# Patient Record
Sex: Male | Born: 1980 | Race: Black or African American | Hispanic: No | Marital: Single | Smoking: Current every day smoker
Health system: Southern US, Community
[De-identification: ages and names within clinical notes are randomized; demographics above are authoritative.]

## PROBLEM LIST (undated history)

## (undated) ENCOUNTER — Emergency Department (HOSPITAL_COMMUNITY): Admission: EM | Payer: 59 | Source: Home / Self Care

## (undated) DIAGNOSIS — R569 Unspecified convulsions: Secondary | ICD-10-CM

## (undated) DIAGNOSIS — F209 Schizophrenia, unspecified: Secondary | ICD-10-CM

---

## 2005-12-15 ENCOUNTER — Emergency Department: Payer: Self-pay | Admitting: Emergency Medicine

## 2006-05-05 ENCOUNTER — Emergency Department: Payer: Self-pay | Admitting: Emergency Medicine

## 2006-05-14 ENCOUNTER — Emergency Department (HOSPITAL_COMMUNITY): Admission: EM | Admit: 2006-05-14 | Discharge: 2006-05-14 | Payer: Self-pay | Admitting: Emergency Medicine

## 2006-05-30 ENCOUNTER — Emergency Department (HOSPITAL_COMMUNITY): Admission: EM | Admit: 2006-05-30 | Discharge: 2006-05-30 | Payer: Self-pay | Admitting: Emergency Medicine

## 2006-12-08 ENCOUNTER — Encounter: Payer: Self-pay | Admitting: Nurse Practitioner

## 2006-12-21 ENCOUNTER — Encounter: Payer: Self-pay | Admitting: Nurse Practitioner

## 2007-01-20 ENCOUNTER — Encounter: Payer: Self-pay | Admitting: Nurse Practitioner

## 2007-08-04 ENCOUNTER — Emergency Department: Payer: Self-pay | Admitting: Internal Medicine

## 2007-12-29 ENCOUNTER — Ambulatory Visit: Payer: Self-pay | Admitting: Internal Medicine

## 2007-12-30 ENCOUNTER — Ambulatory Visit: Payer: Self-pay | Admitting: Internal Medicine

## 2009-05-20 ENCOUNTER — Emergency Department: Payer: Self-pay | Admitting: Emergency Medicine

## 2009-09-25 ENCOUNTER — Emergency Department: Payer: Self-pay | Admitting: Internal Medicine

## 2010-01-20 ENCOUNTER — Emergency Department: Payer: Self-pay | Admitting: Emergency Medicine

## 2010-02-09 ENCOUNTER — Inpatient Hospital Stay: Payer: Self-pay | Admitting: Psychiatry

## 2010-03-24 ENCOUNTER — Emergency Department (HOSPITAL_COMMUNITY): Admission: EM | Admit: 2010-03-24 | Discharge: 2010-03-24 | Payer: Self-pay | Admitting: Emergency Medicine

## 2010-04-28 ENCOUNTER — Emergency Department: Payer: Self-pay | Admitting: Emergency Medicine

## 2010-10-04 LAB — GC/CHLAMYDIA PROBE AMP, GENITAL: GC Probe Amp, Genital: NEGATIVE

## 2013-06-20 ENCOUNTER — Emergency Department (HOSPITAL_COMMUNITY): Payer: Medicaid Other

## 2013-06-20 ENCOUNTER — Encounter (HOSPITAL_COMMUNITY): Payer: Self-pay | Admitting: Emergency Medicine

## 2013-06-20 ENCOUNTER — Inpatient Hospital Stay (HOSPITAL_COMMUNITY)
Admission: EM | Admit: 2013-06-20 | Discharge: 2013-06-22 | DRG: 208 | Disposition: A | Discharge status: Home / Self Care | Payer: Self-pay | Attending: Pulmonary Disease | Admitting: Pulmonary Disease

## 2013-06-20 DIAGNOSIS — Z8659 Personal history of other mental and behavioral disorders: Secondary | ICD-10-CM

## 2013-06-20 DIAGNOSIS — F1020 Alcohol dependence, uncomplicated: Secondary | ICD-10-CM | POA: Diagnosis present

## 2013-06-20 DIAGNOSIS — F10929 Alcohol use, unspecified with intoxication, unspecified: Secondary | ICD-10-CM

## 2013-06-20 DIAGNOSIS — R4182 Altered mental status, unspecified: Secondary | ICD-10-CM

## 2013-06-20 DIAGNOSIS — J9690 Respiratory failure, unspecified, unspecified whether with hypoxia or hypercapnia: Secondary | ICD-10-CM | POA: Diagnosis present

## 2013-06-20 DIAGNOSIS — Z23 Encounter for immunization: Secondary | ICD-10-CM

## 2013-06-20 DIAGNOSIS — J9600 Acute respiratory failure, unspecified whether with hypoxia or hypercapnia: Principal | ICD-10-CM | POA: Diagnosis present

## 2013-06-20 MED ORDER — PROPOFOL 10 MG/ML IV EMUL
5.0000 ug/kg/min | INTRAVENOUS | Status: DC
  Administered 2013-06-21: 10 ug/kg/min via INTRAVENOUS
  Administered 2013-06-21: 45 ug/kg/min via INTRAVENOUS

## 2013-06-20 MED ORDER — SUCCINYLCHOLINE CHLORIDE 20 MG/ML IJ SOLN
120.0000 mg | Freq: Once | INTRAMUSCULAR | Status: AC
  Administered 2013-06-20: 120 mg via INTRAVENOUS

## 2013-06-20 MED ORDER — ETOMIDATE 2 MG/ML IV SOLN
20.0000 mg | Freq: Once | INTRAVENOUS | Status: AC
  Administered 2013-06-20: 20 mg via INTRAVENOUS
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
## 2013-06-21 ENCOUNTER — Emergency Department (HOSPITAL_COMMUNITY): Payer: Medicaid Other

## 2013-06-21 DIAGNOSIS — F1010 Alcohol abuse, uncomplicated: Secondary | ICD-10-CM

## 2013-06-21 DIAGNOSIS — R4182 Altered mental status, unspecified: Secondary | ICD-10-CM

## 2013-06-21 DIAGNOSIS — J9600 Acute respiratory failure, unspecified whether with hypoxia or hypercapnia: Principal | ICD-10-CM

## 2013-06-21 DIAGNOSIS — J9690 Respiratory failure, unspecified, unspecified whether with hypoxia or hypercapnia: Secondary | ICD-10-CM | POA: Diagnosis present

## 2013-06-21 LAB — URINALYSIS, ROUTINE W REFLEX MICROSCOPIC
Bilirubin Urine: NEGATIVE
Ketones, ur: NEGATIVE mg/dL
Nitrite: NEGATIVE
Protein, ur: NEGATIVE mg/dL
Specific Gravity, Urine: 1.015 (ref 1.005–1.030)
Urobilinogen, UA: 0.2 mg/dL (ref 0.0–1.0)

## 2013-06-21 LAB — CBC
HCT: 40.5 % (ref 39.0–52.0)
HCT: 40.5 % (ref 39.0–52.0)
MCH: 31.4 pg (ref 26.0–34.0)
MCHC: 35.3 g/dL (ref 30.0–36.0)
MCHC: 35.6 g/dL (ref 30.0–36.0)
MCV: 89.8 fL (ref 78.0–100.0)
Platelets: 177 10*3/uL (ref 150–400)
RDW: 12.3 % (ref 11.5–15.5)
RDW: 12.6 % (ref 11.5–15.5)
WBC: 11.8 10*3/uL — ABNORMAL HIGH (ref 4.0–10.5)

## 2013-06-21 LAB — COMPREHENSIVE METABOLIC PANEL
Alkaline Phosphatase: 73 U/L (ref 39–117)
BUN: 5 mg/dL — ABNORMAL LOW (ref 6–23)
Calcium: 7.7 mg/dL — ABNORMAL LOW (ref 8.4–10.5)
GFR calc Af Amer: >90 mL/min (ref >90)
Glucose, Bld: 81 mg/dL (ref 70–99)
Potassium: 3.3 mEq/L — ABNORMAL LOW (ref 3.5–5.1)
Total Bilirubin: 0.4 mg/dL (ref 0.3–1.2)
Total Protein: 6.4 g/dL (ref 6.0–8.3)

## 2013-06-21 LAB — POCT I-STAT 3, ART BLOOD GAS (G3+)
Acid-base deficit: 3 mmol/L — ABNORMAL HIGH (ref 0.0–2.0)
Bicarbonate: 22.4 mEq/L (ref 20.0–24.0)
O2 Saturation: 100 %
TCO2: 24 mmol/L (ref 0–100)
pCO2 arterial: 38.7 mmHg (ref 35.0–45.0)
pO2, Arterial: 184 mmHg — ABNORMAL HIGH (ref 80.0–100.0)

## 2013-06-21 LAB — CORTISOL: Cortisol, Plasma: 14.4 ug/dL

## 2013-06-21 LAB — URINE MICROSCOPIC-ADD ON

## 2013-06-21 LAB — MAGNESIUM: Magnesium: 1.6 mg/dL (ref 1.5–2.5)

## 2013-06-21 LAB — BLOOD GAS, ARTERIAL
Acid-base deficit: 3.4 mmol/L — ABNORMAL HIGH (ref 0.0–2.0)
Drawn by: 105551
MECHVT: 600 mL
Patient temperature: 37
RATE: 12 resp/min
TCO2: 17.7 mmol/L (ref 0–100)
pCO2 arterial: 30.8 mmHg — ABNORMAL LOW (ref 35.0–45.0)
pH, Arterial: 7.432 (ref 7.350–7.450)

## 2013-06-21 LAB — PROTIME-INR
INR: 1.15 (ref 0.00–1.49)
INR: 1.12 (ref 0.00–1.49)
Prothrombin Time: 14.2 seconds (ref 11.6–15.2)

## 2013-06-21 LAB — BASIC METABOLIC PANEL
BUN: 8 mg/dL (ref 6–23)
Calcium: 8.3 mg/dL — ABNORMAL LOW (ref 8.4–10.5)
Chloride: 101 mEq/L (ref 96–112)
Creatinine, Ser: 0.78 mg/dL (ref 0.50–1.35)
GFR calc Af Amer: >90 mL/min (ref >90)
GFR calc non Af Amer: >90 mL/min (ref >90)

## 2013-06-21 LAB — LACTIC ACID, PLASMA
Lactic Acid, Venous: 7.1 mmol/L — ABNORMAL HIGH (ref 0.5–2.2)
Lactic Acid, Venous: 3.9 mmol/L — ABNORMAL HIGH (ref 0.5–2.2)

## 2013-06-21 LAB — ETHANOL: Alcohol, Ethyl (B): 312 mg/dL — ABNORMAL HIGH (ref 0–11)

## 2013-06-21 LAB — PROCALCITONIN: Procalcitonin: 0.15 ng/mL

## 2013-06-21 LAB — RAPID URINE DRUG SCREEN, HOSP PERFORMED
Barbiturates: NOT DETECTED
Cocaine: NOT DETECTED
Tetrahydrocannabinol: NOT DETECTED

## 2013-06-21 MED ORDER — FENTANYL CITRATE 0.05 MG/ML IJ SOLN
100.0000 ug | Freq: Once | INTRAMUSCULAR | Status: AC
  Administered 2013-06-21: 100 ug via INTRAVENOUS

## 2013-06-21 MED ORDER — IOHEXOL 300 MG/ML  SOLN
100.0000 mL | Freq: Once | INTRAMUSCULAR | Status: AC | PRN
  Administered 2013-06-21: 100 mL via INTRAVENOUS

## 2013-06-21 MED ORDER — SODIUM CHLORIDE 0.9 % IV SOLN
INTRAVENOUS | Status: DC
  Administered 2013-06-21: 1000 mL via INTRAVENOUS

## 2013-06-21 MED ORDER — PROPOFOL 10 MG/ML IV EMUL
INTRAVENOUS | Status: AC
  Filled 2013-06-21: qty 100

## 2013-06-21 MED ORDER — K PHOS MONO-SOD PHOS DI & MONO 155-852-130 MG PO TABS
250.0000 mg | ORAL_TABLET | Freq: Three times a day (TID) | ORAL | Status: DC
  Administered 2013-06-21 – 2013-06-22 (×3): 250 mg via ORAL
  Filled 2013-06-21 (×4): qty 1

## 2013-06-21 MED ORDER — SODIUM CHLORIDE 0.9 % IV SOLN
INTRAVENOUS | Status: DC
  Administered 2013-06-21 – 2013-06-22 (×4): via INTRAVENOUS

## 2013-06-21 MED ORDER — PANTOPRAZOLE SODIUM 40 MG IV SOLR
40.0000 mg | Freq: Every day | INTRAVENOUS | Status: DC
  Administered 2013-06-21: 40 mg via INTRAVENOUS
  Filled 2013-06-21 (×2): qty 40

## 2013-06-21 MED ORDER — BIOTENE DRY MOUTH MT LIQD
15.0000 mL | Freq: Four times a day (QID) | OROMUCOSAL | Status: DC
  Administered 2013-06-21 – 2013-06-22 (×4): 15 mL via OROMUCOSAL

## 2013-06-21 MED ORDER — FENTANYL CITRATE 0.05 MG/ML IJ SOLN: 50.0000 ug | Freq: Once | INTRAMUSCULAR | Status: DC

## 2013-06-21 MED ORDER — FOLIC ACID 5 MG/ML IJ SOLN
1.0000 mg | Freq: Every day | INTRAMUSCULAR | Status: DC
  Administered 2013-06-21 – 2013-06-22 (×2): 1 mg via INTRAVENOUS
  Filled 2013-06-21 (×2): qty 0.2

## 2013-06-21 MED ORDER — PNEUMOCOCCAL VAC POLYVALENT 25 MCG/0.5ML IJ INJ
0.5000 mL | INJECTION | INTRAMUSCULAR | Status: AC
  Administered 2013-06-22: 0.5 mL via INTRAMUSCULAR
  Filled 2013-06-21: qty 0.5

## 2013-06-21 MED ORDER — FENTANYL CITRATE 0.05 MG/ML IJ SOLN
50.0000 ug | Freq: Once | INTRAMUSCULAR | Status: AC
  Administered 2013-06-21: 50 ug via INTRAVENOUS
  Filled 2013-06-21: qty 2

## 2013-06-21 MED ORDER — SODIUM CHLORIDE 0.9 % IV BOLUS (SEPSIS)
1000.0000 mL | Freq: Once | INTRAVENOUS | Status: AC
  Administered 2013-06-21: 1000 mL via INTRAVENOUS

## 2013-06-21 MED ORDER — THIAMINE HCL 100 MG/ML IJ SOLN
100.0000 mg | Freq: Every day | INTRAMUSCULAR | Status: DC
  Administered 2013-06-21 – 2013-06-22 (×2): 100 mg via INTRAVENOUS
  Filled 2013-06-21 (×2): qty 1

## 2013-06-21 MED ORDER — HEPARIN SODIUM (PORCINE) 5000 UNIT/ML IJ SOLN
5000.0000 [IU] | Freq: Three times a day (TID) | INTRAMUSCULAR | Status: DC
  Administered 2013-06-21 (×2): 5000 [IU] via SUBCUTANEOUS
  Filled 2013-06-21 (×7): qty 1

## 2013-06-21 MED ORDER — FENTANYL CITRATE 0.05 MG/ML IJ SOLN
50.0000 ug | Freq: Once | INTRAMUSCULAR | Status: DC
  Filled 2013-06-21: qty 2

## 2013-06-21 MED ORDER — CHLORHEXIDINE GLUCONATE 0.12 % MT SOLN
15.0000 mL | Freq: Two times a day (BID) | OROMUCOSAL | Status: DC
  Administered 2013-06-21: 15 mL via OROMUCOSAL
  Filled 2013-06-21: qty 15

## 2013-06-21 MED ORDER — INFLUENZA VAC SPLIT QUAD 0.5 ML IM SUSP
0.5000 mL | INTRAMUSCULAR | Status: AC
  Administered 2013-06-22: 0.5 mL via INTRAMUSCULAR
  Filled 2013-06-21: qty 0.5
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
## 2013-06-21 NOTE — ED Notes (Signed)
Pt trying to reach for ET tube after being suctioned, propofol increased.

## 2013-06-21 NOTE — Progress Notes (Signed)
Patient extubated to 4 Liters, able to verbalize his name and birthday, unsure of why he's here. Safety plan explained to patient. Verbalizes understanding
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
## 2013-06-21 NOTE — ED Notes (Signed)
Report given to Angie from [facility].

## 2013-06-21 NOTE — Procedures (Signed)
Extubation Procedure Note  Patient Details:   Name: Larry Vaughn DOB: 1980/11/21 MRN: [id number]   Airway Documentation:     Evaluation  O2 sats: stable throughout Complications: No apparent complications Patient did tolerate procedure well. Bilateral Breath Sounds: Clear Suctioning: Airway Yes Pt extubated to 2L [place] and is tolerating well at this time. No complications noted. RT will monitor.   Kristian Covey Arlene 06/21/2013, 9:47 AM

## 2013-06-21 NOTE — ED Notes (Signed)
Pt began trying to sit up & pull ET tube out. Sedation was increased to prevent pt from removing tubes & lines.

## 2013-06-21 NOTE — Progress Notes (Signed)
Foley removed, patient voiding in urinal. Talking to self, when asked if he's hearing voices, patient states he has been hearing voices for a couple of years. When asked if he sees a psychiatrist, patient states he does not.
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
## 2013-06-22 DIAGNOSIS — Z8659 Personal history of other mental and behavioral disorders: Secondary | ICD-10-CM

## 2013-06-22 LAB — CBC
HCT: 36.8 % — ABNORMAL LOW (ref 39.0–52.0)
Hemoglobin: 12.7 g/dL — ABNORMAL LOW (ref 13.0–17.0)
MCH: 31.4 pg (ref 26.0–34.0)
MCHC: 34.5 g/dL (ref 30.0–36.0)
MCV: 91.1 fL (ref 78.0–100.0)
RBC: 4.04 MIL/uL — ABNORMAL LOW (ref 4.22–5.81)

## 2013-06-22 LAB — BASIC METABOLIC PANEL
BUN: 9 mg/dL (ref 6–23)
Calcium: 7.9 mg/dL — ABNORMAL LOW (ref 8.4–10.5)
Creatinine, Ser: 0.72 mg/dL (ref 0.50–1.35)
GFR calc non Af Amer: >90 mL/min (ref >90)
Glucose, Bld: 77 mg/dL (ref 70–99)
Potassium: 3.3 mEq/L — ABNORMAL LOW (ref 3.5–5.1)

## 2013-06-22 MED ORDER — POTASSIUM CHLORIDE CRYS ER 20 MEQ PO TBCR
20.0000 meq | EXTENDED_RELEASE_TABLET | ORAL | Status: AC
  Administered 2013-06-22 (×2): 20 meq via ORAL
  Filled 2013-06-22 (×2): qty 1
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
## 2013-06-22 NOTE — Progress Notes (Signed)
Patient being discharged home. Discharge instruction given to patient. Patient verbalizes undertstanding. IV's discontinued. Social worker consult for bus pass. Denies need for psychiatric resources although continues to have visual and verbal hallucinations. PCCM physicians made aware. Family available at bedside to transport patient home.

## 2013-06-22 NOTE — Discharge Summary (Signed)
Morayo Leven, MD Pulmonary and Critical Care Medicine [facility] Pager: [phone]  

## 2013-06-28 LAB — COMPREHENSIVE METABOLIC PANEL
Albumin: 3.9 g/dL (ref 3.4–5.0)
BUN: 12 mg/dL (ref 7–18)
Co2: 29 mmol/L (ref 21–32)
EGFR (African American): >60
Osmolality: 270 (ref 275–301)
Potassium: 4.2 mmol/L (ref 3.5–5.1)
SGPT (ALT): 29 U/L (ref 12–78)
Sodium: 135 mmol/L — ABNORMAL LOW (ref 136–145)
Total Protein: 8.5 g/dL — ABNORMAL HIGH (ref 6.4–8.2)

## 2013-06-28 LAB — CBC
HCT: 50.4 % (ref 40.0–52.0)
HGB: 16.7 g/dL (ref 13.0–18.0)
MCH: 30.6 pg (ref 26.0–34.0)
MCHC: 33.1 g/dL (ref 32.0–36.0)
Platelet: 256 10*3/uL (ref 150–440)
RBC: 5.45 10*6/uL (ref 4.40–5.90)
RDW: 13 % (ref 11.5–14.5)

## 2013-06-28 LAB — ETHANOL
Ethanol %: <0.003 % (ref 0.000–0.080)
Ethanol: <3 mg/dL

## 2013-06-28 LAB — SALICYLATE LEVEL: Salicylates, Serum: 1.7 mg/dL

## 2013-06-29 LAB — DRUG SCREEN, URINE
Benzodiazepine, Ur Scrn: NEGATIVE (ref ?–200)
Cannabinoid 50 Ng, Ur ~~LOC~~: NEGATIVE (ref ?–50)
Cocaine Metabolite,Ur ~~LOC~~: NEGATIVE (ref ?–300)
MDMA (Ecstasy)Ur Screen: NEGATIVE (ref ?–500)
Opiate, Ur Screen: NEGATIVE (ref ?–300)
Tricyclic, Ur Screen: NEGATIVE (ref ?–1000)

## 2013-06-30 ENCOUNTER — Inpatient Hospital Stay: Payer: Self-pay | Admitting: Psychiatry

## 2013-07-09 LAB — VALPROIC ACID LEVEL: Valproic Acid: 68 ug/mL

## 2013-07-22 ENCOUNTER — Emergency Department: Payer: Self-pay | Admitting: Emergency Medicine

## 2013-07-22 LAB — CBC
HCT: 45 % (ref 40.0–52.0)
HGB: 15.2 g/dL (ref 13.0–18.0)
MCH: 31.5 pg (ref 26.0–34.0)
MCHC: 33.8 g/dL (ref 32.0–36.0)
MCV: 93 fL (ref 80–100)
Platelet: 160 10*3/uL (ref 150–440)
RBC: 4.82 10*6/uL (ref 4.40–5.90)
RDW: 13.3 % (ref 11.5–14.5)
WBC: 8 10*3/uL (ref 3.8–10.6)

## 2013-07-22 LAB — SALICYLATE LEVEL: Salicylates, Serum: 2.3 mg/dL

## 2013-07-22 LAB — URINALYSIS, COMPLETE
BACTERIA: NONE SEEN
BILIRUBIN, UR: NEGATIVE
Blood: NEGATIVE
Glucose,UR: NEGATIVE mg/dL (ref 0–75)
Ketone: NEGATIVE
LEUKOCYTE ESTERASE: NEGATIVE
Nitrite: NEGATIVE
PH: 6 (ref 4.5–8.0)
Protein: NEGATIVE
RBC,UR: NONE SEEN /HPF (ref 0–5)
SPECIFIC GRAVITY: 1.006 (ref 1.003–1.030)
Squamous Epithelial: NONE SEEN
WBC UR: <1 /HPF (ref 0–5)

## 2013-07-22 LAB — DRUG SCREEN, URINE
AMPHETAMINES, UR SCREEN: NEGATIVE (ref ?–1000)
BARBITURATES, UR SCREEN: NEGATIVE (ref ?–200)
Benzodiazepine, Ur Scrn: NEGATIVE (ref ?–200)
CANNABINOID 50 NG, UR ~~LOC~~: NEGATIVE (ref ?–50)
COCAINE METABOLITE, UR ~~LOC~~: NEGATIVE (ref ?–300)
MDMA (Ecstasy)Ur Screen: NEGATIVE (ref ?–500)
Methadone, Ur Screen: NEGATIVE (ref ?–300)
Opiate, Ur Screen: NEGATIVE (ref ?–300)
Phencyclidine (PCP) Ur S: NEGATIVE (ref ?–25)
Tricyclic, Ur Screen: NEGATIVE (ref ?–1000)

## 2013-07-22 LAB — COMPREHENSIVE METABOLIC PANEL
ALBUMIN: 3.2 g/dL — AB (ref 3.4–5.0)
ALK PHOS: 77 U/L
ANION GAP: 8 (ref 7–16)
AST: 23 U/L (ref 15–37)
BUN: 12 mg/dL (ref 7–18)
Bilirubin,Total: 0.2 mg/dL (ref 0.2–1.0)
CHLORIDE: 103 mmol/L (ref 98–107)
CO2: 27 mmol/L (ref 21–32)
Calcium, Total: 7.8 mg/dL — ABNORMAL LOW (ref 8.5–10.1)
Creatinine: 0.84 mg/dL (ref 0.60–1.30)
EGFR (African American): >60
Glucose: 91 mg/dL (ref 65–99)
Osmolality: 275 (ref 275–301)
Potassium: 3.6 mmol/L (ref 3.5–5.1)
SGPT (ALT): 23 U/L (ref 12–78)
SODIUM: 138 mmol/L (ref 136–145)
Total Protein: 7 g/dL (ref 6.4–8.2)

## 2013-07-22 LAB — ETHANOL
Ethanol %: 0.168 % — ABNORMAL HIGH (ref 0.000–0.080)
Ethanol: 168 mg/dL

## 2013-07-22 LAB — TSH: Thyroid Stimulating Horm: 1.07 u[IU]/mL

## 2013-07-22 LAB — ACETAMINOPHEN LEVEL: Acetaminophen: <2 ug/mL

## 2013-11-02 ENCOUNTER — Emergency Department: Payer: Self-pay | Admitting: Emergency Medicine

## 2013-11-02 LAB — URINALYSIS, COMPLETE
Bacteria: NONE SEEN
Bilirubin,UR: NEGATIVE
GLUCOSE, UR: NEGATIVE mg/dL (ref 0–75)
Ketone: NEGATIVE
LEUKOCYTE ESTERASE: NEGATIVE
NITRITE: NEGATIVE
Ph: 5 (ref 4.5–8.0)
Protein: NEGATIVE
RBC,UR: 2 /HPF (ref 0–5)
Specific Gravity: 1.029 (ref 1.003–1.030)
Squamous Epithelial: 1

## 2013-11-02 LAB — DRUG SCREEN, URINE
AMPHETAMINES, UR SCREEN: NEGATIVE (ref ?–1000)
Barbiturates, Ur Screen: NEGATIVE (ref ?–200)
Benzodiazepine, Ur Scrn: NEGATIVE (ref ?–200)
Cannabinoid 50 Ng, Ur ~~LOC~~: NEGATIVE (ref ?–50)
Cocaine Metabolite,Ur ~~LOC~~: NEGATIVE (ref ?–300)
MDMA (ECSTASY) UR SCREEN: NEGATIVE (ref ?–500)
METHADONE, UR SCREEN: NEGATIVE (ref ?–300)
Opiate, Ur Screen: NEGATIVE (ref ?–300)
Phencyclidine (PCP) Ur S: NEGATIVE (ref ?–25)
Tricyclic, Ur Screen: NEGATIVE (ref ?–1000)

## 2013-11-02 LAB — ETHANOL
Ethanol %: <0.003 % (ref 0.000–0.080)
Ethanol: <3 mg/dL

## 2013-11-02 LAB — COMPREHENSIVE METABOLIC PANEL
ALBUMIN: 4 g/dL (ref 3.4–5.0)
ALK PHOS: 96 U/L
ALT: 17 U/L (ref 12–78)
ANION GAP: 5 — AB (ref 7–16)
AST: 20 U/L (ref 15–37)
BILIRUBIN TOTAL: 0.7 mg/dL (ref 0.2–1.0)
BUN: 10 mg/dL (ref 7–18)
CHLORIDE: 106 mmol/L (ref 98–107)
CO2: 26 mmol/L (ref 21–32)
CREATININE: 1.01 mg/dL (ref 0.60–1.30)
Calcium, Total: 8.7 mg/dL (ref 8.5–10.1)
EGFR (African American): >60
EGFR (Non-African Amer.): >60
Glucose: 90 mg/dL (ref 65–99)
OSMOLALITY: 272 (ref 275–301)
POTASSIUM: 3.9 mmol/L (ref 3.5–5.1)
Sodium: 137 mmol/L (ref 136–145)
Total Protein: 8.1 g/dL (ref 6.4–8.2)

## 2013-11-02 LAB — CBC
HCT: 51.7 % (ref 40.0–52.0)
HGB: 16.8 g/dL (ref 13.0–18.0)
MCH: 30.9 pg (ref 26.0–34.0)
MCHC: 32.4 g/dL (ref 32.0–36.0)
MCV: 95 fL (ref 80–100)
PLATELETS: 214 10*3/uL (ref 150–440)
RBC: 5.42 10*6/uL (ref 4.40–5.90)
RDW: 13.3 % (ref 11.5–14.5)
WBC: 5.3 10*3/uL (ref 3.8–10.6)

## 2013-11-02 LAB — SALICYLATE LEVEL: Salicylates, Serum: <1.7 mg/dL

## 2013-11-02 LAB — ACETAMINOPHEN LEVEL: Acetaminophen: <2 ug/mL

## 2013-11-19 ENCOUNTER — Emergency Department: Payer: Self-pay | Admitting: Emergency Medicine

## 2013-11-19 LAB — DRUG SCREEN, URINE

## 2013-11-19 LAB — CBC
HCT: 49.2 % (ref 40.0–52.0)
HGB: 16.5 g/dL (ref 13.0–18.0)
MCH: 31.8 pg (ref 26.0–34.0)
MCHC: 33.4 g/dL (ref 32.0–36.0)
MCV: 95 fL (ref 80–100)
PLATELETS: 259 10*3/uL (ref 150–440)
RBC: 5.17 10*6/uL (ref 4.40–5.90)
RDW: 13.1 % (ref 11.5–14.5)
WBC: 7.9 10*3/uL (ref 3.8–10.6)

## 2013-11-19 LAB — URINALYSIS, COMPLETE
Bacteria: NONE SEEN
Bilirubin,UR: NEGATIVE
GLUCOSE, UR: NEGATIVE mg/dL (ref 0–75)
Ketone: NEGATIVE
Leukocyte Esterase: NEGATIVE
Nitrite: NEGATIVE
PROTEIN: NEGATIVE
Ph: 6 (ref 4.5–8.0)
RBC,UR: 1 /HPF (ref 0–5)
Specific Gravity: 1.006 (ref 1.003–1.030)
Squamous Epithelial: 1
WBC UR: <1 /HPF (ref 0–5)

## 2013-11-19 LAB — COMPREHENSIVE METABOLIC PANEL
ALK PHOS: 99 U/L
ALT: 62 U/L (ref 12–78)
ANION GAP: 1 — AB (ref 7–16)
AST: 61 U/L — AB (ref 15–37)
Albumin: 4.3 g/dL (ref 3.4–5.0)
BUN: 8 mg/dL (ref 7–18)
Bilirubin,Total: 0.3 mg/dL (ref 0.2–1.0)
CREATININE: 0.71 mg/dL (ref 0.60–1.30)
Calcium, Total: 9.1 mg/dL (ref 8.5–10.1)
Chloride: 103 mmol/L (ref 98–107)
Co2: 33 mmol/L — ABNORMAL HIGH (ref 21–32)
EGFR (African American): >60
Glucose: 86 mg/dL (ref 65–99)
Osmolality: 271 (ref 275–301)
Potassium: 4.1 mmol/L (ref 3.5–5.1)
Sodium: 137 mmol/L (ref 136–145)
TOTAL PROTEIN: 8.5 g/dL — AB (ref 6.4–8.2)

## 2013-11-19 LAB — ETHANOL: Ethanol: <3 mg/dL

## 2013-11-19 LAB — SALICYLATE LEVEL: Salicylates, Serum: 2.4 mg/dL

## 2013-11-19 LAB — ACETAMINOPHEN LEVEL: Acetaminophen: <2 ug/mL

## 2013-12-01 ENCOUNTER — Emergency Department: Payer: Self-pay | Admitting: Emergency Medicine

## 2013-12-01 LAB — CBC
HCT: 50.6 % (ref 40.0–52.0)
HGB: 17.2 g/dL (ref 13.0–18.0)
MCH: 32.1 pg (ref 26.0–34.0)
MCHC: 34 g/dL (ref 32.0–36.0)
MCV: 95 fL (ref 80–100)
PLATELETS: 260 10*3/uL (ref 150–440)
RBC: 5.35 10*6/uL (ref 4.40–5.90)
RDW: 13.1 % (ref 11.5–14.5)
WBC: 7.6 10*3/uL (ref 3.8–10.6)

## 2013-12-01 LAB — COMPREHENSIVE METABOLIC PANEL
AST: 27 U/L (ref 15–37)
Albumin: 3.9 g/dL (ref 3.4–5.0)
Alkaline Phosphatase: 105 U/L
Anion Gap: 4 — ABNORMAL LOW (ref 7–16)
BILIRUBIN TOTAL: 0.5 mg/dL (ref 0.2–1.0)
BUN: 7 mg/dL (ref 7–18)
Calcium, Total: 8.8 mg/dL (ref 8.5–10.1)
Chloride: 104 mmol/L (ref 98–107)
Co2: 29 mmol/L (ref 21–32)
Creatinine: 1.03 mg/dL (ref 0.60–1.30)
EGFR (Non-African Amer.): >60
Glucose: 123 mg/dL — ABNORMAL HIGH (ref 65–99)
Osmolality: 273 (ref 275–301)
POTASSIUM: 3.9 mmol/L (ref 3.5–5.1)
SGPT (ALT): 18 U/L (ref 12–78)
Sodium: 137 mmol/L (ref 136–145)
Total Protein: 8.5 g/dL — ABNORMAL HIGH (ref 6.4–8.2)

## 2013-12-01 LAB — DRUG SCREEN, URINE

## 2013-12-01 LAB — ACETAMINOPHEN LEVEL: Acetaminophen: <2 ug/mL

## 2013-12-01 LAB — ETHANOL: Ethanol %: <0.003 % (ref 0.000–0.080)

## 2013-12-01 LAB — SALICYLATE LEVEL: SALICYLATES, SERUM: 2.6 mg/dL

## 2014-01-06 ENCOUNTER — Other Ambulatory Visit (HOSPITAL_COMMUNITY): Payer: Self-pay | Admitting: Nurse Practitioner

## 2014-01-06 DIAGNOSIS — R519 Headache, unspecified: Secondary | ICD-10-CM

## 2014-01-06 DIAGNOSIS — R55 Syncope and collapse: Secondary | ICD-10-CM

## 2014-01-06 DIAGNOSIS — R51 Headache: Principal | ICD-10-CM

## 2014-01-06 DIAGNOSIS — H538 Other visual disturbances: Secondary | ICD-10-CM

## 2014-01-10 ENCOUNTER — Encounter (HOSPITAL_COMMUNITY): Payer: Self-pay

## 2014-01-10 ENCOUNTER — Ambulatory Visit (HOSPITAL_COMMUNITY)
Admission: RE | Admit: 2014-01-10 | Discharge: 2014-01-10 | Disposition: A | Discharge status: Home / Self Care | Payer: Medicaid Other | Source: Ambulatory Visit | Attending: Nurse Practitioner | Admitting: Nurse Practitioner

## 2014-01-10 DIAGNOSIS — R51 Headache: Secondary | ICD-10-CM | POA: Insufficient documentation

## 2014-01-10 DIAGNOSIS — R55 Syncope and collapse: Secondary | ICD-10-CM | POA: Diagnosis not present

## 2014-01-10 DIAGNOSIS — R519 Headache, unspecified: Secondary | ICD-10-CM

## 2014-01-10 DIAGNOSIS — H538 Other visual disturbances: Secondary | ICD-10-CM

## 2014-07-31 ENCOUNTER — Emergency Department (HOSPITAL_COMMUNITY)
Admission: EM | Admit: 2014-07-31 | Discharge: 2014-07-31 | Disposition: A | Discharge status: Home / Self Care | Payer: Medicare Other | Attending: Emergency Medicine | Admitting: Emergency Medicine

## 2014-07-31 ENCOUNTER — Encounter (HOSPITAL_COMMUNITY): Payer: Self-pay

## 2014-07-31 DIAGNOSIS — N529 Male erectile dysfunction, unspecified: Secondary | ICD-10-CM | POA: Diagnosis not present

## 2014-07-31 DIAGNOSIS — Z720 Tobacco use: Secondary | ICD-10-CM | POA: Insufficient documentation

## 2014-07-31 DIAGNOSIS — K649 Unspecified hemorrhoids: Secondary | ICD-10-CM | POA: Diagnosis present

## 2014-07-31 LAB — POC OCCULT BLOOD, ED: FECAL OCCULT BLD: NEGATIVE

## 2014-07-31 MED ORDER — DOCUSATE SODIUM 100 MG PO CAPS: 100.0000 mg | ORAL_CAPSULE | Freq: Two times a day (BID) | ORAL | Status: DC

## 2014-07-31 NOTE — ED Notes (Signed)
Pt reports has been having problems with getting an erection for the past 5 years.  Also wants to be evaluated for hemorrhoids.  Says has pain with BMs.

## 2014-07-31 NOTE — ED Provider Notes (Signed)
CSN: [id number]     Arrival date & time 07/31/14  [id number] History   First MD Initiated Contact with Patient 07/31/14 1057     Chief Complaint  Patient presents with  . Hemorrhoids     (Consider location/radiation/quality/duration/timing/severity/associated sxs/prior Treatment) HPI Comments: Patient states difficulty getting erections for the past 5 years. Denies any testicular pain or penile pain. No discharge or bleeding. Also feels he has hemorrhoids because sometimes he has to strain on the toilet. States he has a bowel movement every day. Denies any blood in the stool. Denies any abdominal pain. Denies any nausea or vomiting. No fevers, chills, chest pain or shortness of breath.  The history is provided by the patient.    History reviewed. No pertinent past medical history. History reviewed. No pertinent past surgical history. No family history on file. History  Substance Use Topics  . Smoking status: Current Every Day Smoker  . Smokeless tobacco: Not on file  . Alcohol Use: Yes     Comment: occ    Review of Systems  Constitutional: Negative for fever and activity change.  HENT: Negative for congestion and rhinorrhea.   Respiratory: Negative for cough, chest tightness and shortness of breath.   Cardiovascular: Negative for chest pain.  Gastrointestinal: Negative for nausea, vomiting and abdominal pain.  Genitourinary: Negative for dysuria and hematuria.  Musculoskeletal: Negative for myalgias and arthralgias.  Skin: Negative for rash.  Neurological: Negative for dizziness, weakness and headaches.  A complete 10 system review of systems was obtained and all systems are negative except as noted in the HPI and PMH.      Allergies  Other  Home Medications   Prior to Admission medications   Medication Sig Start Date End Date Taking? Authorizing Provider  docusate sodium (COLACE) 100 MG capsule Take 1 capsule (100 mg total) by mouth every 12 (twelve) hours. 07/31/14    Glynn OctaveStephen Latausha Flamm, MD   BP 145/89 mmHg  Pulse 92  Temp(Src) 99.1 F (37.3 C) (Core (Comment))  Resp 18  Ht 5' 7.5" (1.715 m)  Wt 146 lb 7 oz (66.424 kg)  BMI 22.58 kg/m2  SpO2 100% Physical Exam  Constitutional: He is oriented to person, place, and time. He appears well-developed and well-nourished. No distress.  HENT:  Head: Normocephalic and atraumatic.  Mouth/Throat: Oropharynx is clear and moist. No oropharyngeal exudate.  Eyes: Conjunctivae and EOM are normal. Pupils are equal, round, and reactive to light.  Neck: Normal range of motion. Neck supple.  No meningismus.  Cardiovascular: Normal rate, regular rhythm, normal heart sounds and intact distal pulses.   No murmur heard. Pulmonary/Chest: Effort normal and breath sounds normal. No respiratory distress.  Abdominal: Soft. There is no tenderness. There is no rebound and no guarding.  Genitourinary:  Normal-appearing external genitalia. No testicular pain. No hemorrhoids. No gross blood.  Musculoskeletal: Normal range of motion. He exhibits no edema or tenderness.  Neurological: He is alert and oriented to person, place, and time. No cranial nerve deficit. He exhibits normal muscle tone. Coordination normal.  No ataxia on finger to nose bilaterally. No pronator drift. 5/5 strength throughout. CN 2-12 intact. Negative Romberg. Equal grip strength. Sensation intact. Gait is normal.   Skin: Skin is warm.  Psychiatric: He has a normal mood and affect. His behavior is normal.  Nursing note and vitals reviewed.   ED Course  Procedures (including critical care time) Labs Review Labs Reviewed  POC OCCULT BLOOD, ED    Imaging Review No results found.  EKG Interpretation None      MDM   Final diagnoses:  Erectile dysfunction, unspecified erectile dysfunction type   Erectile dysfunction 5 years. Patient will be referred to urology. Notably he is on Seroquel which may be contributing to his inability to get an  erection.  FOBT negative.  No hemrrhoids on exam. Start stool softeners.   Glynn Octave, MD 07/31/14 [id number]

## 2014-07-31 NOTE — Discharge Instructions (Signed)
Erectile Dysfunction Erectile dysfunction is the inability to get or sustain a good enough erection to have sexual intercourse. Erectile dysfunction may involve:  Inability to get an erection.  Lack of enough hardness to allow penetration.  Loss of the erection before sex is finished.  Premature ejaculation. CAUSES  Certain drugs, such as:  Pain relievers.  Antihistamines.  Antidepressants.  Blood pressure medicines.  Water pills (diuretics).  Ulcer medicines.  Muscle relaxants.  Illegal drugs.  Excessive drinking.  Psychological causes, such as:  Anxiety.  Depression.  Sadness.  Exhaustion.  Performance fear.  Stress.  Physical causes, such as:  Artery problems. This may include diabetes, smoking, liver disease, or atherosclerosis.  High blood pressure.  Hormonal problems, such as low testosterone.  Obesity.  Nerve problems. This may include back or pelvic injuries, diabetes mellitus, multiple sclerosis, or Parkinson disease. SYMPTOMS  Inability to get an erection.  Lack of enough hardness to allow penetration.  Loss of the erection before sex is finished.  Premature ejaculation.  Normal erections at some times, but with frequent unsatisfactory episodes.  Orgasms that are not satisfactory in sensation or frequency.  Low sexual satisfaction in either partner because of erection problems.  A curved penis occurring with erection. The curve may cause pain or may be too curved to allow for intercourse.  Never having nighttime erections. DIAGNOSIS Your caregiver can often diagnose this condition by:  Performing a physical exam to find other diseases or specific problems with the penis.  Asking you detailed questions about the problem.  Performing blood tests to check for diabetes mellitus or to measure hormone levels.  Performing urine tests to find other underlying health conditions.  Performing an ultrasound exam to check for  scarring.  Performing a test to check blood flow to the penis.  Doing a sleep study at home to measure nighttime erections. TREATMENT   You may be prescribed medicines by mouth.  You may be given medicine injections into the penis.  You may be prescribed a vacuum pump with a ring.  Penile implant surgery may be performed. You may receive:  An inflatable implant.  A semirigid implant.  Blood vessel surgery may be performed. HOME CARE INSTRUCTIONS  If you are prescribed oral medicine, you should take the medicine as prescribed. Do not increase the dosage without first discussing it with your physician.  If you are using self-injections, be careful to avoid any veins that are on the surface of the penis. Apply pressure to the injection site for 5 minutes.  If you are using a vacuum pump, make sure you have read the instructions before using it. Discuss any questions with your physician before taking the pump home. SEEK MEDICAL CARE IF:  You experience pain that is not responsive to the pain medicine you have been prescribed.  You experience nausea or vomiting. SEEK IMMEDIATE MEDICAL CARE IF:   When taking oral or injectable medications, you experience an erection that lasts longer than 4 hours. If your physician is unavailable, go to the nearest emergency room for evaluation. An erection that lasts much longer than 4 hours can result in permanent damage to your penis.  You have pain that is severe.  You develop redness, severe pain, or severe swelling of your penis.  You have redness spreading up into your groin or lower abdomen.  You are unable to pass your urine. Document Released: 07/05/2000 Document Revised: 03/10/2013 Document Reviewed: 12/10/2012 Exit[employer] Patient Information 2015 [employer]. This information is not   intended to replace advice given to you by your health care provider. Make sure you discuss any questions you have with your health care provider.  

## 2014-08-01 LAB — POC OCCULT BLOOD, ED: FECAL OCCULT BLD: NEGATIVE
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
## 2014-11-12 NOTE — Consult Note (Signed)
Psychiatry: Follow-up note on this patient with schizophrenia and alcohol abuse.  He has not shown behavior problems in the emergency room.  Continues to be paranoid and disorganized in one-on-one conversation.  Insight is poor.  Patient continues to be at high risk for his ongoing dangerous behavior outside the hospital.  Multiple failures of outpatient treatment with dangerous results.  At this point he is appropriate [facility].  Not appropriate due to safety concerns for transfer to our inpatient hospital.  Continue current medicine and monitoring in the emergency room.  Electronic Signatures: Audery Amellapacs, Roslind Michaux T (MD)  (Signed on 04-May-15 22:29)  Authored  Last Updated: 04-May-15 22:29 by Audery Amellapacs, Saory Carriero T (MD)
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
## 2014-11-12 NOTE — Consult Note (Signed)
PATIENT NAME:  Larry Vaughn, Larry Vaughn MR#:  [id number] DATE OF BIRTH:  [birth year]  DATE OF CONSULTATION:  12/02/2013  CONSULTING PHYSICIAN:  Audery AmelJohn T. Frank Pilger, MD  PSYCHIATRY NOTE:  Followup for this 34 year old gentleman with schizophrenia. He was brought to the Emergency Room under involuntary petition yesterday filed by his mother. Petition details a lot of odd behaviors at home, but does not report any violence or threats of violence or acute dangerousness. Since being in the Emergency Room, the patient really has no chief complaint. He denies having any thoughts about hurting himself or hurting anyone else. He says that his mood has been feeling okay. He denies any side effects from his medicine. Denies any physical complaints in general. Mentally, he is easily arousable when he is sleeping and when awake appears alert and oriented.  Still makes intermittent eye contact with somewhat sluggish psychomotor activity and speech that is decreased in total amount, but easy to understand. Affect remains flat but mood stated as being okay. Thoughts a little bit disorganized, but did not make any obviously bizarre or delusional statements and did not make any threats. Denies suicidal or homicidal ideation. Agrees to continue taking his medication. The patient does not currently feel like he needs to be in the hospital, and at this point, he no longer meets commitment criteria. Socially, we know that he has a court date today, which could potentially result in him having to do some time in jail. If that does not happen, he still has outpatient followup already set up with [facility] with appointments to see Dr. Marguerite OleaMoffett. He has been compliant with his medicine.   He will be taken off involuntary commitment today, as I think he no longer meets criteria. We have discussed the case with his mother, who is willing to come pick him up. Emergency Room doctor has been briefed and is agreeable with the plan for discharge.   DIAGNOSIS,  PRINCIPAL AND PRIMARY: Schizophrenia, paranoid type.   SECONDARY DIAGNOSES: AXIS I:  Alcohol dependence, in early remission.  AXIS II:  Deferred.  AXIS III:  No further diagnosis.  AXIS V:  Functioning at time of discharge is 50.  ____________________________ Audery AmelJohn T. Tejah Brekke, MD jtc:dmm D: 12/02/2013 10:50:46 ET T: 12/02/2013 11:03:06 ET JOB#: [id number]  cc: Audery AmelJohn T. Peniel Biel, MD, <Dictator> Audery AmelJOHN T Sayaka Hoeppner MD ELECTRONICALLY SIGNED 12/03/2013 15:35

## 2014-11-12 NOTE — Consult Note (Signed)
PATIENT NAME:  Larry Vaughn, Larry Vaughn MR#:  [id number] DATE OF BIRTH:  Apr 10, 1981  DATE OF CONSULTATION:  12/01/2013  REFERRING PHYSICIAN:   CONSULTING PHYSICIAN:  Audery AmelJohn T. Clapacs, MD  IDENTIFYING INFORMATION AND REASON FOR CONSULTATION: A 34 year old man with a history of schizophrenia and alcohol abuse who was sent here on a commitment petition taken out by his family. The petition lists multiple bizarre behaviors that he has been doing, but does not report any specific acute dangerousness. Consult for evaluation of appropriate treatment.   HISTORY OF PRESENT ILLNESS: Information obtained from the patient and the chart. According to the commitment petition, he has been exhibiting bizarre behaviors, making bizarre gestures, staying mostly in his room listening to his music very loud. He told his family that he received a phone call telling him he did not have to go to his court date today. It does not say anything about substance abuse and does not say anything about any dangerous behavior or specific threats. On interview today, the patient tells me he does not know why he is here. He just knows the sheriff came over and picked him up. He says he has mostly been staying to himself since he got home. Claims he has been taking his medicine but cannot tell me anything about what it is or how many pills he takes. Denies that he has been using alcohol. Denies suicidal or homicidal ideation. When I asked him about his court date, he tells me he does not want to talk about it and refuses to go any further with that.   PAST PSYCHIATRIC HISTORY: The patient has a history of schizophrenia with long-standing paranoia, bizarre behavior and has had dangerous behavior in the past including aggression to others. He also has a history of alcohol abuse which has resulted in worsening dangerous behavior and violence in the past. He has been recently treated on a combination of Zyprexa, Risperdal and Trileptal when he was in the  Emergency Room recently. This seemed to keep him fairly stable without a lot of bizarre behavior.    SOCIAL HISTORY: He is single. He lives with his biological parents. He has had some legal problems currently and has a court date that had been scheduled for this morning and now has been rescheduled for tomorrow morning. Has had drunk driving charges in the past.   FAMILY HISTORY: None known.   PAST MEDICAL HISTORY:  No significant ongoing medical problems.   SUBSTANCE ABUSE HISTORY: Primarily alcohol abuse, has been long-standing. No history of DTs.   CURRENT MEDICATIONS: It should be Zyprexa 20 mg at night, Risperdal 2 mg twice a day, Cogentin 1 mg twice a day, Trileptal 900 mg at night.   ALLERGIES: No known drug allergies.   REVIEW OF SYSTEMS: The patient has no complaints. Review of systems is all entirely negative.   MENTAL STATUS EXAMINATION: Adequately groomed man, looks his stated age, lying on a stretcher in the Emergency Room. Passively interactive. Minimal eye contact. Psychomotor activity limited. Speech quiet and decreased in amount. Affect flat. Mood stated as being okay. Thoughts are minimal in amount, halting, did not make any obviously bizarre or delusional statements. Denies hallucinations. Denies suicidal or homicidal ideation. Judgment and insight chronically some impairment. Not cooperative with memory testing. Does not remember what his medicines actually are. Fund of knowledge hard to assess.   LABORATORY RESULTS: On presentation here his alcohol level was zero. Salicylates and Tylenol undetected. Chemistry panel: Slightly elevated glucose. CBC normal. Drug screen  all negative.   ASSESSMENT: A 34 year old man with schizophrenia. On my examination today, he appears to be basically the same as he was when he left here just a few days ago. The commitment petition mentions evidence of his continuing to have psychotic symptoms but does not mention any specific dangerousness. It  is unclear what has changed that would require rehospitalization. I do not know whether he has been compliant with his medicine or not as apparently he takes responsibility for this, but he claims he has been.   TREATMENT PLAN: My recommendation is that he not be admitted to the hospital. We have tried to reach his family by phone and no one is answering so we cannot confirm a place to live right now. He does have a court date tomorrow. We will re-evaluate tomorrow and consider releasing him if we can get the family on the phone. Meanwhile, I have restarted him on his psychiatric medicine and we will re-evaluate later.   DIAGNOSIS, PRINCIPAL AND PRIMARY:  AXIS I: Schizophrenia, paranoid type.   SECONDARY DIAGNOSES: AXIS I: Alcohol dependence.  AXIS II: Deferred.  AXIS III: No diagnosis.  AXIS IV: Moderate to severe from legal charges.  AXIS V: Functioning at time of evaluation 49.   ____________________________ Audery Amel, MD jtc:cs D: 12/01/2013 17:43:26 ET T: 12/01/2013 18:55:26 ET JOB#: [id number]  cc: Audery Amel, MD, <Dictator> Audery Amel MD ELECTRONICALLY SIGNED 12/03/2013 15:34
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
## 2014-11-12 NOTE — Consult Note (Signed)
PATIENT NAME:  Larry Vaughn, Larry Vaughn MR#:  [id number] DATE OF BIRTH:  1981-03-01  DATE OF CONSULTATION:  11/25/2013  CONSULTING PHYSICIAN:  Audery AmelJohn T. Leean Amezcua, MD  HISTORY OF PRESENT ILLNESS: The patient is 34 year old male with a history of schizophrenia and alcohol abuse. He has been waiting in the Emergency Room here since May 1st because of involuntary commitment. He had been psychotic at the time he came in and there was a concern about dangerousness. He has a history of dangerousness when he is psychotic that made him inappropriate for admission to the hospital here. Since he has been waiting in the Emergency Room, we have had [facility], but have not yet gotten word about bed availability. Meanwhile, the patient has been compliant with his antipsychotic medication. Today, he tells me that his mood is feeling better. He denies any suicidal or homicidal ideation. Says that he is thinking clearly. Promises that he will follow up with the C[facility] team. Acknowledges that he should stop drinking.   REVIEW OF SYSTEMS: Denies hallucinations. Denies suicidal or homicidal ideation. No physical complaints. Rest of the review of systems is negative.   MENTAL STATUS EXAMINATION:  Adequately groomed, given his situation, gentleman who looks his stated age. Cooperative with the interview. Good eye contact, normal psychomotor activity. Speech is decreased in total amount but understandable. Affect blunted. Mood is fine. Thoughts appear slow but lucid. No bizarre or delusional statements made. Denies hallucinations. Denies suicidal or homicidal ideation. Alert and oriented x 4. Cognitive slowing present. Normal fund of knowledge.   CURRENT MEDICATIONS:  Risperdal 2 mg b.i.d., olanzapine 20 mg at night, Cogentin 1 mg q.6 hours p.r.n. for EPS.   ASSESSMENT:  A 34 year old man with schizophrenia and alcohol abuse. He long ago sobered up and has been compliant with medicine. He is not acutely psychotic  at this point. He denies suicidal or homicidal ideation. Acknowledges that violent behavior is inappropriate and understands that it will give him in serious trouble. He is agreeable to go back and live with his mother. Mother is okay with him coming back home. He had community support team services already in place, who are aware of the discharge and agree to follow up. I will give him prescriptions for his current medicines. I have discussed the case with the Emergency Room attending and taken the patient off involuntary commitment.   DIAGNOSIS, PRINCIPAL AND PRIMARY:  AXIS I: Schizophrenia, paranoid type.   SECONDARY DIAGNOSES: AXIS I: Alcohol dependence.  AXIS II: Deferred.  AXIS III: No diagnosis.  AXIS IV: Moderate from chronic illness.  AXIS V: Functioning at time of discharge is 54.   ____________________________ Audery AmelJohn T. Bryna Razavi, MD jtc:dmm D: 11/25/2013 12:32:17 ET T: 11/25/2013 12:49:57 ET JOB#: [id number]  cc: Audery AmelJohn T. Deloros Beretta, MD, <Dictator> Audery AmelJOHN T Hanan Moen MD ELECTRONICALLY SIGNED 11/29/2013 0:42

## 2014-11-12 NOTE — Consult Note (Signed)
Brief Consult Note: Diagnosis: Schizophrenia.   Patient was seen by consultant.   Recommend further assessment or treatment.   Comments: Pt seen in ED BHU. He was recently bonded out of jail and was non complaint with meds and has poor insight with his illness. He was restarted on meds and he is showing some improvement in his symptoms with no acute agitation or aggressive behavior noted. [facility]. He is complaint with meds.   Plan: Continue Olanzapine 20mg  po qhs Risperdal 2mg  po bid.  Will continue to monitor and [facility].  Electronic Signatures: Rhunette CroftFaheem, Nashayla Telleria S (MD)  (Signed [phone]-May-15 10:32)  Authored: Brief Consult Note   Last Updated: 05-May-15 10:32 by Rhunette CroftFaheem, Shaquanda Graves S (MD)

## 2014-11-12 NOTE — Consult Note (Signed)
Brief Consult Note: Diagnosis: Schizophrenia.   Patient was seen by consultant.   Recommend further assessment or treatment.   Comments: Pt seen in ED BHU. He presented to the ED as he was iVD by his mother due to running in the underwear in the yard. He [place] court [place] today [place]. Pt currently denied any Si/HI or plans. He stated that he does not know, why he is in the hospital. He stated that he was taking medications which was prescribed to him after his last hospitalization. He denied any Ah, VH. He was lying in bed. He will be discharged today in stable condition.   Plan: Will d/c IVC as pt does not meet the IVC criteria at this time.  He will continue on the meds as prescribed. Follow up with [facility].  Has a court appearance today.  Denied Si/HI or plans. No perceptual disturbances noted.  Electronic Signatures: Rhunette CroftFaheem, Joab Carden S (MD)  (Signed 14-May-15 10:44)  Authored: Brief Consult Note   Last Updated: 14-May-15 10:44 by Rhunette CroftFaheem, Quinetta Shilling S (MD)

## 2014-11-22 ENCOUNTER — Ambulatory Visit (INDEPENDENT_AMBULATORY_CARE_PROVIDER_SITE_OTHER): Payer: Medicare Other | Admitting: Urology

## 2014-11-22 DIAGNOSIS — N5201 Erectile dysfunction due to arterial insufficiency: Secondary | ICD-10-CM

## 2014-11-22 DIAGNOSIS — N500 Atrophy of testis: Secondary | ICD-10-CM

## 2017-11-26 ENCOUNTER — Other Ambulatory Visit: Payer: Self-pay

## 2017-11-26 ENCOUNTER — Emergency Department (HOSPITAL_COMMUNITY)
Admission: EM | Admit: 2017-11-26 | Discharge: 2017-11-26 | Disposition: A | Discharge status: Home / Self Care | Payer: Medicare Other | Attending: Emergency Medicine | Admitting: Emergency Medicine

## 2017-11-26 ENCOUNTER — Encounter (HOSPITAL_COMMUNITY): Payer: Self-pay | Admitting: Emergency Medicine

## 2017-11-26 DIAGNOSIS — F17200 Nicotine dependence, unspecified, uncomplicated: Secondary | ICD-10-CM | POA: Diagnosis not present

## 2017-11-26 DIAGNOSIS — K6289 Other specified diseases of anus and rectum: Secondary | ICD-10-CM | POA: Insufficient documentation

## 2017-11-26 DIAGNOSIS — Z202 Contact with and (suspected) exposure to infections with a predominantly sexual mode of transmission: Secondary | ICD-10-CM | POA: Diagnosis not present

## 2017-11-26 DIAGNOSIS — Z711 Person with feared health complaint in whom no diagnosis is made: Secondary | ICD-10-CM

## 2017-11-26 HISTORY — DX: Unspecified convulsions: R56.9

## 2017-11-26 LAB — GC/CHLAMYDIA PROBE AMP (~~LOC~~) NOT AT ARMC
CHLAMYDIA, DNA PROBE: NEGATIVE
NEISSERIA GONORRHEA: NEGATIVE

## 2017-11-26 MED ORDER — DOCUSATE SODIUM 100 MG PO CAPS: 100.0000 mg | ORAL_CAPSULE | Freq: Two times a day (BID) | ORAL | 0 refills | Status: DC

## 2017-11-26 NOTE — ED Triage Notes (Signed)
Pt states he wants to be test for stds.

## 2017-11-26 NOTE — ED Provider Notes (Signed)
[facility] EMERGENCY DEPARTMENT Provider Note   CSN: [id number] Arrival date & time: 11/26/17  0054     History   Chief Complaint Chief Complaint  Patient presents with  . Exposure to STD    HPI Larry Vaughn is a 37 y.o. male.  Patient presents requesting testing for syphilis.  He thinks he might have syphilis because he has been experiencing a rash on his forearms that comes and goes.  He also thinks he has a hemorrhoid because he has rectal pain.  No rectal bleeding.     Past Medical History:  Diagnosis Date  . Seizures [facility])     Patient Active Problem List   Diagnosis Date Noted  . History of psychiatric disorder 06/22/2013  . Respiratory failure (HCC) 06/21/2013  . Altered mental status 06/21/2013  . Alcohol intoxication with blood level over 0.3 06/21/2013  . Acute respiratory failure (HCC) 06/21/2013    History reviewed. No pertinent surgical history.      Home Medications    Prior to Admission medications   Medication Sig Start Date End Date Taking? Authorizing Provider  docusate sodium (COLACE) 100 MG capsule Take 1 capsule (100 mg total) by mouth every 12 (twelve) hours. 11/26/17   Gilda Crease, MD    Family History No family history on file.  Social History Social History   Tobacco Use  . Smoking status: Current Every Day Smoker  . Smokeless tobacco: Never Used  Substance Use Topics  . Alcohol use: Yes    Comment: occ  . Drug use: Yes    Types: Marijuana     Allergies   Other   Review of Systems Review of Systems  Gastrointestinal: Positive for rectal pain.  Skin: Positive for rash.  All other systems reviewed and are negative.    Physical Exam Updated Vital Signs BP (!) 147/79 (BP Location: Right Arm)   Pulse 79   Temp 98.3 F (36.8 C) (Oral)   Resp 16   Ht  (1.702 m)   Wt 65.8 kg (145 lb)   SpO2 100%   BMI 22.71 kg/m   Physical Exam  Constitutional: He is oriented to person, place, and time. He  appears well-developed and well-nourished. No distress.  HENT:  Head: Normocephalic and atraumatic.  Right Ear: Hearing normal.  Left Ear: Hearing normal.  Nose: Nose normal.  Mouth/Throat: Oropharynx is clear and moist and mucous membranes are normal.  Eyes: Pupils are equal, round, and reactive to light. Conjunctivae and EOM are normal.  Neck: Normal range of motion. Neck supple.  Cardiovascular: Regular rhythm, S1 normal and S2 normal. Exam reveals no gallop and no friction rub.  No murmur heard. Pulmonary/Chest: Effort normal and breath sounds normal. No respiratory distress. He exhibits no tenderness.  Abdominal: Soft. Normal appearance and bowel sounds are normal. There is no hepatosplenomegaly. There is no tenderness. There is no rebound, no guarding, no tenderness at McBurney's point and negative Murphy's sign. No hernia.  Musculoskeletal: Normal range of motion.  Neurological: He is alert and oriented to person, place, and time. He has normal strength. No cranial nerve deficit or sensory deficit. Coordination normal. GCS eye subscore is 4. GCS verbal subscore is 5. GCS motor subscore is 6.  Skin: Skin is warm, dry and intact. No rash noted. No cyanosis.  Psychiatric: He has a normal mood and affect. His speech is normal and behavior is normal. Thought content normal.  Nursing note and vitals reviewed.    ED  Treatments / Results  Labs (all labs ordered are listed, but only abnormal results are displayed) Labs Reviewed  RPR  HIV ANTIBODY (ROUTINE TESTING)  GC/CHLAMYDIA PROBE AMP () NOT AT [facility]    EKG None  Radiology No results found.  Procedures Procedures (including critical care time)  Medications Ordered in ED Medications - No data to display   Initial Impression / Assessment and Plan / ED Course  I have reviewed the triage vital signs and the nursing notes.  Pertinent labs & imaging results that were available during my care of the patient were  reviewed by me and considered in my medical decision making (see chart for details).     Patient thinks he has syphilis.  It is not clear why he thinks this.  Indicates that he has a rash on his arms, but there is no rash present currently.  He denies actually having exposure to any known STD.  He has not had any urethral discharge or dysuria.  No penile lesions.  Will perform testing.  Cursory rectal examination reveals no hemorrhoids, no fissures, no lesions.  He does endorse occasional constipation.  Will restart Colace that he was treated with previously.  Final Clinical Impressions(s) / ED Diagnoses   Final diagnoses:  Concern about STD in male without diagnosis  Rectal pain    ED Discharge Orders        Ordered    docusate sodium (COLACE) 100 MG capsule  Every 12 hours     11/26/17 0146       Gilda Crease, MD 11/26/17 [phone]

## 2017-11-27 LAB — HIV ANTIBODY (ROUTINE TESTING W REFLEX): HIV Screen 4th Generation wRfx: NONREACTIVE

## 2017-11-27 LAB — RPR: RPR Ser Ql: NONREACTIVE

## 2017-12-09 ENCOUNTER — Ambulatory Visit (INDEPENDENT_AMBULATORY_CARE_PROVIDER_SITE_OTHER): Payer: Medicare Other | Admitting: Urology

## 2017-12-09 DIAGNOSIS — N5201 Erectile dysfunction due to arterial insufficiency: Secondary | ICD-10-CM | POA: Diagnosis not present

## 2017-12-29 ENCOUNTER — Inpatient Hospital Stay
Admission: AD | Admit: 2017-12-29 | Discharge: 2018-01-05 | DRG: 885 | Disposition: A | Discharge status: Home / Self Care | Payer: Medicare Other | Attending: Psychiatry | Admitting: Psychiatry

## 2017-12-29 ENCOUNTER — Emergency Department
Admission: EM | Admit: 2017-12-29 | Discharge: 2017-12-29 | Disposition: A | Discharge status: Other Acute Inpatient Hospital | Payer: Medicare Other | Attending: Emergency Medicine | Admitting: Emergency Medicine

## 2017-12-29 ENCOUNTER — Other Ambulatory Visit: Payer: Self-pay

## 2017-12-29 ENCOUNTER — Encounter: Payer: Self-pay | Admitting: Emergency Medicine

## 2017-12-29 DIAGNOSIS — F79 Unspecified intellectual disabilities: Secondary | ICD-10-CM

## 2017-12-29 DIAGNOSIS — F1020 Alcohol dependence, uncomplicated: Secondary | ICD-10-CM | POA: Diagnosis present

## 2017-12-29 DIAGNOSIS — F209 Schizophrenia, unspecified: Secondary | ICD-10-CM | POA: Insufficient documentation

## 2017-12-29 DIAGNOSIS — F1220 Cannabis dependence, uncomplicated: Secondary | ICD-10-CM | POA: Diagnosis present

## 2017-12-29 DIAGNOSIS — F203 Undifferentiated schizophrenia: Secondary | ICD-10-CM | POA: Diagnosis present

## 2017-12-29 DIAGNOSIS — F17200 Nicotine dependence, unspecified, uncomplicated: Secondary | ICD-10-CM | POA: Diagnosis present

## 2017-12-29 DIAGNOSIS — F17210 Nicotine dependence, cigarettes, uncomplicated: Secondary | ICD-10-CM | POA: Diagnosis present

## 2017-12-29 DIAGNOSIS — Z9119 Patient's noncompliance with other medical treatment and regimen: Secondary | ICD-10-CM

## 2017-12-29 DIAGNOSIS — R440 Auditory hallucinations: Secondary | ICD-10-CM

## 2017-12-29 DIAGNOSIS — Z79899 Other long term (current) drug therapy: Secondary | ICD-10-CM | POA: Insufficient documentation

## 2017-12-29 HISTORY — DX: Schizophrenia, unspecified: F20.9

## 2017-12-29 LAB — CBC WITH DIFFERENTIAL/PLATELET
Basophils Absolute: 0 10*3/uL (ref 0–0.1)
Basophils Relative: 1 %
Eosinophils Absolute: 0.1 10*3/uL (ref 0–0.7)
Eosinophils Relative: 1 %
HEMATOCRIT: 49.4 % (ref 40.0–52.0)
HEMOGLOBIN: 17 g/dL (ref 13.0–18.0)
LYMPHS ABS: 2 10*3/uL (ref 1.0–3.6)
LYMPHS PCT: 32 %
MCH: 32.6 pg (ref 26.0–34.0)
MCHC: 34.3 g/dL (ref 32.0–36.0)
MCV: 94.9 fL (ref 80.0–100.0)
Monocytes Absolute: 0.4 10*3/uL (ref 0.2–1.0)
Monocytes Relative: 7 %
NEUTROS PCT: 59 %
Neutro Abs: 3.8 10*3/uL (ref 1.4–6.5)
Platelets: 234 10*3/uL (ref 150–440)
RBC: 5.21 MIL/uL (ref 4.40–5.90)
RDW: 13.9 % (ref 11.5–14.5)
WBC: 6.4 10*3/uL (ref 3.8–10.6)

## 2017-12-29 LAB — COMPREHENSIVE METABOLIC PANEL
ALT: 16 U/L — ABNORMAL LOW (ref 17–63)
AST: 36 U/L (ref 15–41)
Albumin: 4.8 g/dL (ref 3.5–5.0)
Alkaline Phosphatase: 83 U/L (ref 38–126)
Anion gap: 9 (ref 5–15)
BUN: 9 mg/dL (ref 6–20)
CHLORIDE: 102 mmol/L (ref 101–111)
CO2: 26 mmol/L (ref 22–32)
Calcium: 9 mg/dL (ref 8.9–10.3)
Creatinine, Ser: 0.96 mg/dL (ref 0.61–1.24)
Glucose, Bld: 83 mg/dL (ref 65–99)
POTASSIUM: 3.8 mmol/L (ref 3.5–5.1)
Sodium: 137 mmol/L (ref 135–145)
Total Bilirubin: 0.8 mg/dL (ref 0.3–1.2)
Total Protein: 8.5 g/dL — ABNORMAL HIGH (ref 6.5–8.1)

## 2017-12-29 LAB — URINE DRUG SCREEN, QUALITATIVE (ARMC ONLY)
AMPHETAMINES, UR SCREEN: NOT DETECTED
Barbiturates, Ur Screen: NOT DETECTED
Benzodiazepine, Ur Scrn: NOT DETECTED
COCAINE METABOLITE, UR ~~LOC~~: NOT DETECTED
Cannabinoid 50 Ng, Ur ~~LOC~~: NOT DETECTED
MDMA (ECSTASY) UR SCREEN: NOT DETECTED
Methadone Scn, Ur: NOT DETECTED
Opiate, Ur Screen: NOT DETECTED
Phencyclidine (PCP) Ur S: NOT DETECTED
TRICYCLIC, UR SCREEN: NOT DETECTED

## 2017-12-29 LAB — ETHANOL: ALCOHOL ETHYL (B): 52 mg/dL — AB (ref <10)

## 2017-12-29 MED ORDER — HYDROXYZINE HCL 50 MG PO TABS
50.0000 mg | ORAL_TABLET | Freq: Three times a day (TID) | ORAL | Status: DC | PRN
  Filled 2017-12-29: qty 1

## 2017-12-29 MED ORDER — MAGNESIUM HYDROXIDE 400 MG/5ML PO SUSP: 30.0000 mL | Freq: Every day | ORAL | Status: DC | PRN

## 2017-12-29 MED ORDER — ACETAMINOPHEN 325 MG PO TABS
650.0000 mg | ORAL_TABLET | Freq: Four times a day (QID) | ORAL | Status: DC | PRN
  Administered 2018-01-03: 650 mg via ORAL
  Filled 2017-12-29: qty 2

## 2017-12-29 MED ORDER — PALIPERIDONE ER 3 MG PO TB24
6.0000 mg | ORAL_TABLET | Freq: Every day | ORAL | Status: DC
  Administered 2017-12-30: 6 mg via ORAL
  Filled 2017-12-29 (×2): qty 2

## 2017-12-29 MED ORDER — TRAZODONE HCL 100 MG PO TABS
100.0000 mg | ORAL_TABLET | Freq: Every day | ORAL | Status: DC
  Administered 2017-12-29 – 2018-01-04 (×7): 100 mg via ORAL
  Filled 2017-12-29 (×8): qty 1

## 2017-12-29 MED ORDER — NICOTINE 21 MG/24HR TD PT24
21.0000 mg | MEDICATED_PATCH | Freq: Every day | TRANSDERMAL | Status: DC
  Administered 2017-12-30 – 2018-01-05 (×6): 21 mg via TRANSDERMAL
  Filled 2017-12-29 (×6): qty 1

## 2017-12-29 MED ORDER — DIVALPROEX SODIUM 500 MG PO DR TAB
500.0000 mg | DELAYED_RELEASE_TABLET | Freq: Three times a day (TID) | ORAL | Status: DC
  Administered 2017-12-29 – 2018-01-02 (×11): 500 mg via ORAL
  Filled 2017-12-29 (×13): qty 1

## 2017-12-29 MED ORDER — DOCUSATE SODIUM 100 MG PO CAPS
100.0000 mg | ORAL_CAPSULE | Freq: Two times a day (BID) | ORAL | Status: DC
  Administered 2017-12-29 – 2018-01-05 (×12): 100 mg via ORAL
  Filled 2017-12-29 (×15): qty 1

## 2017-12-29 MED ORDER — ALUM & MAG HYDROXIDE-SIMETH 200-200-20 MG/5ML PO SUSP: 30.0000 mL | ORAL | Status: DC | PRN

## 2017-12-29 MED ORDER — ACETAMINOPHEN 325 MG PO TABS
650.0000 mg | ORAL_TABLET | Freq: Four times a day (QID) | ORAL | Status: DC | PRN
  Administered 2017-12-29: 650 mg via ORAL
  Filled 2017-12-29: qty 2

## 2017-12-29 NOTE — ED Notes (Signed)
Dr. Jennet MaduroPucilowska talking with Patient, Patient remains without behaviors noted, Patient is safe, will continue to monitor.

## 2017-12-29 NOTE — ED Notes (Signed)
Patient is taking a shower.

## 2017-12-29 NOTE — ED Notes (Addendum)
Pt states that he was talking "sex talk" with a woman, and that she wanted him to bring something to her house. When he showed up, she called the police and denied any sex talk or requests with him. Pt rambles regarding voices within his head and states he was brought here after being in jail because he reported hearing voices to jail personnel.

## 2017-12-29 NOTE — BH Assessment (Signed)
Assessment Note  Larry Vaughn is an 37 y.o. male wh presents to the ED under IVC. Per ED Triage RN: "Pt Arrives with officer who states he came in contact with patient when he was at a womans house he had a no contact order. Stated he is hearing voices in his head which told him to have sexual contact with her. Patient states he has been hearing voices again for about 2 weeks. Denies voices telling him to harm himself or having done any harm to self."  PT denies SI/HI     Diagnosis: Schizophrenia  Past Medical History:  Past Medical History:  Diagnosis Date  . Schizophrenia (HCC)   . Seizures (HCC)     History reviewed. No pertinent surgical history.  Family History: History reviewed. No pertinent family history.  Social History:  reports that he has been smoking cigarettes.  He has been smoking about 1.00 pack per day. He has never used smokeless tobacco. He reports that he drinks alcohol. He reports that he has current or past drug history. Drug: Marijuana.  Additional Social History:  Alcohol / Drug Use Pain Medications: SEE MAR Prescriptions: SEE MAR Over the Counter: SEE MAR History of alcohol / drug use?: Yes Substance #1 Name of Substance 1: Alcohol 1 - Age of First Use: 20 1 - Frequency: Daily 1 - Last Use / Amount: 12/29/17 Substance #2 Name of Substance 2: Marijuana 2 - Age of First Use: 24  CIWA: CIWA-Ar BP: 129/74 Pulse Rate: 80 COWS:    Allergies:  Allergies  Allergen Reactions  . Other     Pt says is allergic to a medication but doesn't know what it is.  Pt says when he took it, he had stiffness in his body and was unable to walk     Home Medications:  (Not in a hospital admission)  OB/GYN Status:  No LMP for male patient.  General Assessment Data [facility] Assessment: In system Is this a Tele or Face-to-Face Assessment?: Face-to-Face Is this an Initial Assessment or a Re-assessment for this encounter?: Initial  Assessment Marital status: Single Is patient pregnant?: No Pregnancy Status: No Living Arrangements: Parent Can pt return to current living arrangement?: Yes Admission Status: Involuntary Is patient capable of signing voluntary admission?: No Referral Source: Self/Family/Friend Insurance type: Medicare  Medical Screening Exam [facility] Walk-in ONLY) Medical Exam completed: Yes  Crisis Care Plan Living Arrangements: Parent Legal Guardian: Other:(Self) Name of Psychiatrist: n/a Name of Therapist: n/a  Education Status Is patient currently in school?: No Is the patient employed, unemployed or receiving disability?: Unemployed  Risk to self with the past 6 months Suicidal Ideation: No Has patient been a risk to self within the past 6 months prior to admission? : No Suicidal Intent: No Has patient had any suicidal intent within the past 6 months prior to admission? : No Is patient at risk for suicide?: No Suicidal Plan?: No Has patient had any suicidal plan within the past 6 months prior to admission? : No Access to Means: No What has been your use of drugs/alcohol within the last 12 months?: admits current use Previous Attempts/Gestures: No How many times?: 0 Other Self Harm Risks: n/a Triggers for Past Attempts: None known Intentional Self Injurious Behavior: None Family Suicide History: No Recent stressful life event(s): Conflict (Comment) Persecutory voices/beliefs?: No Depression: No Depression Symptoms: (none noted) Substance abuse history and/or treatment for substance abuse?: Yes Suicide prevention information given to non-admitted patients: Not  applicable  Risk to Others within the past 6 months Homicidal Ideation: No Does patient have any lifetime risk of violence toward others beyond the six months prior to admission? : No Thoughts of Harm to Others: No Current Homicidal Intent: No Current Homicidal Plan: No Access to Homicidal Means: No Identified Victim:  n/a History of harm to others?: No Assessment of Violence: None Noted Violent Behavior Description: pt denies Does patient have access to weapons?: No Criminal Charges Pending?: Yes Describe Pending Criminal Charges: 2nd degree trespassing,  Does patient have a court date: Yes Court Date: 12/31/17 Is patient on probation?: No  Psychosis Hallucinations: Auditory Delusions: Erotomanic  Mental Status Report Appearance/Hygiene: Unremarkable, In scrubs Eye Contact: Good Motor Activity: Freedom of movement Speech: Logical/coherent, Slow, Soft Level of Consciousness: Alert Mood: Anxious Affect: Appropriate to circumstance Anxiety Level: Moderate Thought Processes: Coherent, Relevant Judgement: Partial Orientation: Person, Place, Time, Situation, Appropriate for developmental age Obsessive Compulsive Thoughts/Behaviors: Severe  Cognitive Functioning Concentration: Normal Memory: Recent Intact, Remote Intact Is patient IDD: No Is patient DD?: No Insight: Poor Impulse Control: Poor Appetite: Good Have you had any weight changes? : No Change Sleep: No Change Total Hours of Sleep: 8 Vegetative Symptoms: None  ADLScreening [facility] Assessment Services) Patient's cognitive ability adequate to safely complete daily activities?: Yes Patient able to express need for assistance with ADLs?: Yes Independently performs ADLs?: Yes (appropriate for developmental age)  Prior Inpatient Therapy Prior Inpatient Therapy: Yes Prior Therapy Dates: Several Prior Therapy Facilty/Provider(s): [facility] Reason for Treatment: Schizophrenia  Prior Outpatient Therapy Prior Outpatient Therapy: No Does patient have an ACCT team?: No Does patient have Intensive In-House Services?  : No Does patient have [facility] services? : No Does patient have P4CC services?: No  ADL Screening (condition at time of admission) Patient's cognitive ability adequate to safely complete daily activities?: Yes Is the patient  deaf or have difficulty hearing?: No Does the patient have difficulty seeing, even when wearing glasses/contacts?: No Does the patient have difficulty concentrating, remembering, or making decisions?: No Patient able to express need for assistance with ADLs?: Yes Does the patient have difficulty dressing or bathing?: No Independently performs ADLs?: Yes (appropriate for developmental age) Does the patient have difficulty walking or climbing stairs?: No Weakness of Arms/Hands: None  Home Assistive Devices/Equipment Home Assistive Devices/Equipment: None  Therapy Consults (therapy consults require a physician order) PT Evaluation Needed: No OT Evalulation Needed: No SLP Evaluation Needed: No Abuse/Neglect Assessment (Assessment to be complete while patient is alone) Abuse/Neglect Assessment Can Be Completed: Yes Physical Abuse: Denies Verbal Abuse: Denies Sexual Abuse: Denies Exploitation of patient/patient's resources: Denies Self-Neglect: Denies Values / Beliefs Cultural Requests During Hospitalization: None Spiritual Requests During Hospitalization: None Consults Spiritual Care Consult Needed: No Social Work Consult Needed: No      Additional Information 1:1 In Past 12 Months?: No CIRT Risk: No Elopement Risk: No Does patient have medical clearance?: Yes  Child/Adolescent Assessment Running Away Risk: (PT IS AN ADULT)  Disposition:  Disposition Initial Assessment Completed for this Encounter: Yes Disposition of Patient: Admit Type of inpatient treatment program: Adult Patient refused recommended treatment: No Mode of transportation if patient is discharged?: Car  On Site Evaluation by:   Reviewed with Physician:    Bayron Dalto D Ersel Wadleigh 12/29/2017 6:03 PM

## 2017-12-29 NOTE — ED Notes (Signed)
Patient has been calm and cooperative, watching tv, will continue to monitor.

## 2017-12-29 NOTE — ED Notes (Signed)
Patient is alert, no signs of distress, ate 100% of supper and beverage, no behavioral issues, patient does talk to self, watches tv, will continue to monitor, q 15 minute checks and camera surveillance in progress for safety.

## 2017-12-29 NOTE — ED Notes (Signed)
PT  IVC  MOVED  TO  [facility] UNIT  PENDING  GOING  TO  [facility]  TONIGHT

## 2017-12-29 NOTE — ED Triage Notes (Signed)
Arrives with officer who states he came in contact with patient when he was at a womans house he had a no contact order. Stated he is hearing voices in his head which told him to have sexual contact with her. Patient states he has been hearing voices again for about 2 weeks. Denies voices telling him to harm himself or having done any harm to self.

## 2017-12-29 NOTE — BH Assessment (Signed)
[facility] by Dr. Jennet MaduroPucilowska.  Attending Physician will be Dr. Jennet MaduroPucilowska.   [facility] SenecaPhyllis.   Intake Paper Work has been signed and placed on patient chart.  ER staff is aware of the admission:  [facility]   Dr. Lamont Snowballifenbark , ER MD   Toniann FailWendy Patient's Nurse   Ethelene BrownsAnthony Patient Access.

## 2017-12-29 NOTE — Tx Team (Signed)
Initial Treatment Plan 12/29/2017 11:32 PM Larry Vaughn JYN:[id number]    PATIENT STRESSORS: Financial difficulties Medication change or noncompliance Substance abuse   PATIENT STRENGTHS: Capable of independent living Communication skills Motivation for treatment/growth Special hobby/interest Supportive family/friends   PATIENT IDENTIFIED PROBLEMS: Patient is hearing voices    Depression/anxiety    Medication none compliance    Delusions         DISCHARGE CRITERIA:  Adequate post-discharge living arrangements Medical problems require only outpatient monitoring Motivation to continue treatment in a less acute level of care Need for constant or close observation no longer present Safe-care adequate arrangements made  PRELIMINARY DISCHARGE PLAN: Attend 12-step recovery group Outpatient therapy Participate in family therapy Return to previous living arrangement  PATIENT/FAMILY INVOLVEMENT: This treatment plan has been presented to and reviewed with the patient, Larry Vaughn,.  The patient have been given the opportunity to ask questions and make suggestions.  Lelan PonsAlexander  Bradden Tadros, RN 12/29/2017, 11:32 PM

## 2017-12-29 NOTE — Consult Note (Signed)
[facility] Consult   Reason for Consult:  psychosis Referring Physician:  Dr. Beather Arbour Patient Identification: Larry Vaughn MRN:  [id number] Principal Diagnosis: Undifferentiated schizophrenia [facility]) Diagnosis:   Patient Active Problem List   Diagnosis Date Noted  . Undifferentiated schizophrenia ([place]) [F20.3] 12/29/2017    Priority: High  . Cannabis use disorder, moderate, dependence ([place]) [F12.20] 12/29/2017  . Alcohol use disorder, moderate, dependence ([place]) [F10.20] 12/29/2017  . Tobacco use disorder [F17.200] 12/29/2017  . Intellectual disability [F79] 12/29/2017  . History of psychiatric disorder [Z86.59] 06/22/2013  . Respiratory failure ([place]) [J96.90] 06/21/2013  . Altered mental status [R41.82] 06/21/2013  . Alcohol intoxication with blood level over 0.3 [R78.0] 06/21/2013  . Acute respiratory failure ([place]) [J96.00] 06/21/2013    Total Time spent with patient: 1 hour   Identifying data. Mr. Sweetman is a 37 year old male with a history of psychosis and substance abuse.  Chief complaint. "She invited me over."  Information was obtained from the patient and the chart. The patient was brought to the ER after he went to a neighbors house to have sex. Apparently, there was a restraining order in place with charges of stoking pending. The patient reports that he was at his parents house where he "could hear" that neighbors "sex talk". He heard that she asked him to bring her some questionnaire. He could not see her but was convinced tha she was talking to him fro another room. He knows that he should not have any contact with her. When he arrived at her house, she became upset and called tha police. He reports that he had 4 beers that day and smoked marijuana. He denies any symptoms of depression, anxiety or psychosis. He adamantly denies any mental illness. He is not taking any medications because he does not need it AND he is unable to afford it. He was in prison for 6  years, released 3 years ago and apparently lost his disability check.  Past psychiatric history. He was hospitalized at [facility] before. I see two admissions for alcohol related problems. He has intellectual disability and was in special educations classes.   Family psychiatric history. Denies any.  Social history. He has no income and resides with his parents. He expects to get his check back "soon" on the third. There are several charges pending, all misdemeanors. He has court date in two days.   Risk to Self:   Risk to Others:   Prior Inpatient Therapy:   Prior Outpatient Therapy:    Past Medical History:  Past Medical History:  Diagnosis Date  . Schizophrenia ([place])   . Seizures ([place])    History reviewed. No pertinent surgical history. Family History: History reviewed. No pertinent family history.   Social History:  Social History   Substance and Sexual Activity  Alcohol Use Yes   Comment: occ     Social History   Substance and Sexual Activity  Drug Use Yes  . Types: Marijuana    Social History   Socioeconomic History  . Marital status: Single    Spouse name: Not on file  . Number of children: Not on file  . Years of education: Not on file  . Highest education level: Not on file  Occupational History  . Not on file  Social Needs  . Financial resource strain: Not on file  . Food insecurity:    Worry: Not on file    Inability: Not on file  . Transportation needs:    Medical: Not  on file    Non-medical: Not on file  Tobacco Use  . Smoking status: Current Every Day Smoker    Packs/day: 1.00    Types: Cigarettes  . Smokeless tobacco: Never Used  Substance and Sexual Activity  . Alcohol use: Yes    Comment: occ  . Drug use: Yes    Types: Marijuana  . Sexual activity: Not on file  Lifestyle  . Physical activity:    Days per week: Not on file    Minutes per session: Not on file  . Stress: Not on file  Relationships  . Social connections:    Talks on phone:  Not on file    Gets together: Not on file    Attends religious service: Not on file    Active member of club or organization: Not on file    Attends meetings of clubs or organizations: Not on file    Relationship status: Not on file  Other Topics Concern  . Not on file  Social History Narrative  . Not on file   Additional Social History:    Allergies:   Allergies  Allergen Reactions  . Other     Pt says is allergic to a medication but doesn't know what it is.  Pt says when he took it, he had stiffness in his body and was unable to walk     Labs:  Results for orders placed or performed during the hospital encounter of 12/29/17 (from the past 48 hour(s))  Comprehensive metabolic panel     Status: Abnormal   Collection Time: 12/29/17  9:31 AM  Result Value Ref Range   Sodium 137 135 - 145 mmol/L   Potassium 3.8 3.5 - 5.1 mmol/L   Chloride 102 101 - 111 mmol/L   CO2 26 22 - 32 mmol/L   Glucose, Bld 83 65 - 99 mg/dL   BUN 9 6 - 20 mg/dL   Creatinine, Ser 0.96 0.61 - 1.24 mg/dL   Calcium 9.0 8.9 - 10.3 mg/dL   Total Protein 8.5 (H) 6.5 - 8.1 g/dL   Albumin 4.8 3.5 - 5.0 g/dL   AST 36 15 - 41 U/L   ALT 16 (L) 17 - 63 U/L   Alkaline Phosphatase 83 38 - 126 U/L   Total Bilirubin 0.8 0.3 - 1.2 mg/dL   GFR calc non Af Amer >60 >60 mL/min   GFR calc Af Amer >60 >60 mL/min    Comment: (NOTE) The eGFR has been calculated using the CKD EPI equation. This calculation has not been validated in all clinical situations. eGFR's persistently <60 mL/min signify possible Chronic Kidney Disease.    Anion gap 9 5 - 15    Comment: Performed at [facility], [address] [place], [place] [address]  Ethanol     Status: Abnormal   Collection Time: 12/29/17  9:31 AM  Result Value Ref Range   Alcohol, Ethyl (B) 52 (H) <10 mg/dL    Comment: (NOTE) Lowest detectable limit for serum alcohol is 10 mg/dL. For medical purposes only. Performed at [facility], [address] [place],  [address]   Urine Drug Screen, Qualitative     Status: None   Collection Time: 12/29/17  9:31 AM  Result Value Ref Range   Tricyclic, Ur Screen NONE DETECTED NONE DETECTED   Amphetamines, Ur Screen NONE DETECTED NONE DETECTED   MDMA (Ecstasy)Ur Screen NONE DETECTED NONE DETECTED   Cocaine Metabolite,Ur [place] NONE DETECTED NONE DETECTED  Opiate, Ur Screen NONE DETECTED NONE DETECTED   Phencyclidine (PCP) Ur S NONE DETECTED NONE DETECTED   Cannabinoid 50 Ng, Ur [place] NONE DETECTED NONE DETECTED   Barbiturates, Ur Screen NONE DETECTED NONE DETECTED   Benzodiazepine, Ur Scrn NONE DETECTED NONE DETECTED   Methadone Scn, Ur NONE DETECTED NONE DETECTED    Comment: (NOTE) Tricyclics + metabolites, urine    Cutoff 1000 ng/mL Amphetamines + metabolites, urine  Cutoff 1000 ng/mL MDMA (Ecstasy), urine              Cutoff 500 ng/mL Cocaine Metabolite, urine          Cutoff 300 ng/mL Opiate + metabolites, urine        Cutoff 300 ng/mL Phencyclidine (PCP), urine         Cutoff 25 ng/mL Cannabinoid, urine                 Cutoff 50 ng/mL Barbiturates + metabolites, urine  Cutoff 200 ng/mL Benzodiazepine, urine              Cutoff 200 ng/mL Methadone, urine                   Cutoff 300 ng/mL The urine drug screen provides only a preliminary, unconfirmed analytical test result and should not be used for non-medical purposes. Clinical consideration and professional judgment should be applied to any positive drug screen result due to possible interfering substances. A more specific alternate chemical method must be used in order to obtain a confirmed analytical result. Gas chromatography / mass spectrometry (GC/MS) is the preferred confirmat ory method. Performed at [facility], [address] [place], [place] [address]   CBC with Diff     Status: None   Collection Time: 12/29/17  9:31 AM  Result Value Ref Range   WBC 6.4 3.8 - 10.6 K/uL   RBC 5.21 4.40 - 5.90 MIL/uL    Hemoglobin 17.0 13.0 - 18.0 g/dL   HCT 49.4 40.0 - 52.0 %   MCV 94.9 80.0 - 100.0 fL   MCH 32.6 26.0 - 34.0 pg   MCHC 34.3 32.0 - 36.0 g/dL   RDW 13.9 11.5 - 14.5 %   Platelets 234 150 - 440 K/uL   Neutrophils Relative % 59 %   Neutro Abs 3.8 1.4 - 6.5 K/uL   Lymphocytes Relative 32 %   Lymphs Abs 2.0 1.0 - 3.6 K/uL   Monocytes Relative 7 %   Monocytes Absolute 0.4 0.2 - 1.0 K/uL   Eosinophils Relative 1 %   Eosinophils Absolute 0.1 0 - 0.7 K/uL   Basophils Relative 1 %   Basophils Absolute 0.0 0 - 0.1 K/uL    Comment: Performed at [facility], [address] [place], [place] [address]    Current Facility-Administered Medications  Medication Dose Route Frequency Provider Last Rate Last Dose  . acetaminophen (TYLENOL) tablet 650 mg  650 mg Oral Q6H PRN Rudene Re, MD   650 mg at 12/29/17 1235   Current Outpatient Medications  Medication Sig Dispense Refill  . docusate sodium (COLACE) 100 MG capsule Take 1 capsule (100 mg total) by mouth every 12 (twelve) hours. 60 capsule 0    Musculoskeletal: Strength & Muscle Tone: within normal limits Gait & Station: normal Patient leans: N/A  Psychiatric Specialty Exam: Physical Exam  Nursing note and vitals reviewed. Psychiatric: His affect is angry and inappropriate. His speech is rapid and/or pressured. He is actively hallucinating. Thought content  is paranoid and delusional. Cognition and memory are impaired. He expresses impulsivity.    Review of Systems  Neurological: Positive for seizures.  Psychiatric/Behavioral: Positive for hallucinations and substance abuse.  All other systems reviewed and are negative.   Blood pressure 129/74, pulse 80, temperature 98.4 F (36.9 C), temperature source Oral, resp. rate 18, height 5' 7.5" (1.715 m), weight 56.7 kg (125 lb), SpO2 99 %.Body mass index is 19.29 kg/m.  General Appearance: Fairly Groomed  Eye Contact:  Good  Speech:  Slurred  Volume:  Normal  Mood:   Irritable  Affect:  Congruent  Thought Process:  Linear and Descriptions of Associations: Tangential  Orientation:  Full (Time, Place, and Person)  Thought Content:  Delusions, Hallucinations: Auditory and Paranoid Ideation  Suicidal Thoughts:  No  Homicidal Thoughts:  No  Memory:  Immediate;   Poor Recent;   Poor Remote;   Poor  Judgement:  Poor  Insight:  Lacking  Psychomotor Activity:  Normal  Concentration:  Concentration: Poor and Attention Span: Poor  Recall:  Poor  Fund of Knowledge:  Fair  Language:  Fair  Akathisia:  No  Handed:  Right  AIMS (if indicated):     Assets:  Communication Skills Desire for Improvement Housing Physical Health Resilience Social Support  ADL's:  Intact  Cognition:  Impaired,  Mild  Sleep:        Treatment Plan Summary: Daily contact with patient to assess and evaluate symptoms and progress in treatment and Medication management   PLAN: Will admit to psychiatry for treatment of psychosis and substance abuse.  Disposition: Recommend psychiatric Inpatient admission when medically cleared. Supportive therapy provided about ongoing stressors.  Orson Slick, MD 12/29/2017 5:40 PM

## 2017-12-29 NOTE — ED Provider Notes (Signed)
[facility] Emergency Department Provider Note  ____________________________________________  Time seen: Approximately 3:16 PM  I have reviewed the triage vital signs and the nursing notes.   HISTORY  Chief Complaint Psychiatric Evaluation   HPI Larry Vaughn is a 37 y.o. male with history of schizophrenia who presents IVC by police for hallucinations.  Patient tells me that he keeps hearing his neighbor call him to her house to have sex with her.  Today he heard her asking for some documents.  He reports that he went to her house to take those documents.  When he walked in she started screaming for him to get out and called the police.  Patient reports that he does not see his neighbor he just hears her voice in his head.  Patient reports that he does not take any psychiatric medications because he does not need any.  He denies any drug or alcohol use. He denies SI or HI.  Chief Complaint: hallucinations Quality: auditory Severity: severe Duration: several weeks Context: hears neighbor calling him to have sex with her Modifying factors: nothing makes the voices go away Associated signs/symptoms: no SI or HI    Past Medical History:  Diagnosis Date  . Schizophrenia (H[facility])   . Seizures [facility](HCC)     Patient Active Problem List   Diagnosis Date Noted  . Undifferentiated schizophrenia ([facility]) 12/29/2017  . Cannabis use disorder, moderate, dependence ([facility]) 12/29/2017  . Alcohol use disorder, moderate, dependence ([facility]) 12/29/2017  . Tobacco use disorder 12/29/2017  . Intellectual disability 12/29/2017  . History of psychiatric disorder 06/22/2013  . Respiratory failure (H[facility]) 06/21/2013  . Altered mental status 06/21/2013  . Alcohol intoxication with blood level over 0.3 06/21/2013  . Acute respiratory failure (HCC) 06/21/2013    History reviewed. No pertinent surgical history.  Prior to Admission medications   Medication Sig Start Date End Date Taking?  Authorizing Provider  docusate sodium (COLACE) 100 MG capsule Take 1 capsule (100 mg total) by mouth every 12 (twelve) hours. 11/26/17   Gilda CreasePollina, Christopher J, MD    Allergies Other  History reviewed. No pertinent family history.  Social History Social History   Tobacco Use  . Smoking status: Current Every Day Smoker    Packs/day: 1.00    Types: Cigarettes  . Smokeless tobacco: Never Used  Substance Use Topics  . Alcohol use: Yes    Comment: occ  . Drug use: Yes    Types: Marijuana    Review of Systems  Constitutional: Negative for fever. Eyes: Negative for visual changes. ENT: Negative for sore throat. Neck: No neck pain  Cardiovascular: Negative for chest pain. Respiratory: Negative for shortness of breath. Gastrointestinal: Negative for abdominal pain, vomiting or diarrhea. Genitourinary: Negative for dysuria. Musculoskeletal: Negative for back pain. Skin: Negative for rash. Neurological: Negative for headaches, weakness or numbness. Psych: No SI or HI. + auditory hallucinations  ____________________________________________   PHYSICAL EXAM:  VITAL SIGNS: ED Triage Vitals [12/29/17 0916]  Enc Vitals Group     BP 129/74     Pulse Rate 80     Resp 18     Temp 98.4 F (36.9 C)     Temp Source Oral     SpO2 99 %     Weight 125 lb (56.7 kg)     Height 5' 7.5" (1.715 m)     Head Circumference      Peak Flow      Pain Score 0  Pain Loc      Pain Edu?      Excl. in GC?     Constitutional: Alert and oriented x 3, no apparent distress. HEENT:      Head: Normocephalic and atraumatic.         Eyes: Conjunctivae are normal. Sclera is non-icteric.       Mouth/Throat: Mucous membranes are moist.       Neck: Supple with no signs of meningismus. Cardiovascular: Regular rate and rhythm. No murmurs, gallops, or rubs. 2+ symmetrical distal pulses are present in all extremities. No JVD. Respiratory: Normal respiratory effort. Lungs are clear to auscultation  bilaterally. No wheezes, crackles, or rhonchi.  Gastrointestinal: Soft, non tender, and non distended with positive bowel sounds. No rebound or guarding. Musculoskeletal: Nontender with normal range of motion in all extremities. No edema, cyanosis, or erythema of extremities. Neurologic: Normal speech and language. Face is symmetric. Moving all extremities. No gross focal neurologic deficits are appreciated. Skin: Skin is warm, dry and intact. No rash noted. Psychiatric: Auditory hallucinations, poor insight, no SI or HI ____________________________________________   LABS (all labs ordered are listed, but only abnormal results are displayed)  Labs Reviewed  COMPREHENSIVE METABOLIC PANEL - Abnormal; Notable for the following components:      Result Value   Total Protein 8.5 (*)    ALT 16 (*)    All other components within normal limits  ETHANOL - Abnormal; Notable for the following components:   Alcohol, Ethyl (B) 52 (*)    All other components within normal limits  URINE DRUG SCREEN, QUALITATIVE ([facility] ONLY)  CBC WITH DIFFERENTIAL/PLATELET   ____________________________________________  EKG  none  ____________________________________________  RADIOLOGY  none  ____________________________________________   PROCEDURES  Procedure(s) performed: None Procedures Critical Care performed:  None ____________________________________________   INITIAL IMPRESSION / ASSESSMENT AND PLAN / ED COURSE  37 y.o. male with history of schizophrenia who presents IVC by police for hallucinations.  Patient meets IVC criteria and will remain IVC and to evaluated by psychiatry.  Labs done for medical clearance showing alcohol level 52, negative drug screen.  Patient is medically cleared.      As part of my medical decision making, I reviewed the following data within the electronic MEDICAL RECORD NUMBER Nursing notes reviewed and incorporated, Labs reviewed , Old chart reviewed, A consult was  requested and obtained from this/these consultant(s) Psychiatry, Notes from prior ED visits and [place] Controlled Substance Database    Pertinent labs & imaging results that were available during my care of the patient were reviewed by me and considered in my medical decision making (see chart for details).    ____________________________________________   FINAL CLINICAL IMPRESSION(S) / ED DIAGNOSES  Final diagnoses:  Schizophrenia, unspecified type (HCC)  Auditory hallucinations      NEW MEDICATIONS STARTED DURING THIS VISIT:  ED Discharge Orders    None       Note:  This document was prepared using Dragon voice recognition software and may include unintentional dictation errors.    Don Perking, [place], MD 12/29/17 1520

## 2017-12-29 NOTE — ED Notes (Signed)
First Nurse Note:  [facility] [place]  Patient alert. Placed in Triage waiting area behind closed door with officer awaiting triage.

## 2017-12-29 NOTE — ED Notes (Signed)
Report to include Situation, Background, Assessment, and Recommendations received from Wendy RN. Patient alert and oriented, warm and dry, in no acute distress. Patient denies SI, HI, AVH and pain. Patient made aware of Q15 minute rounds and security cameras for their safety. Patient instructed to come to me with needs or concerns.  

## 2017-12-29 NOTE — [facility] Group Notes (Signed)
[facility] Group Notes:  (Nursing/MHT/Case Management/Adjunct)  Date:  12/29/2017  Time:  9:30 PM  Type of Therapy:  Group Therapy  Participation Level:  Active  Participation Quality:  Appropriate  Affect:  Flat  Cognitive:  Appropriate  Insight:  Good  Engagement in Group:  Engaged   Modes of Intervention:  Support  Summary of Progress/Problems:  Larry Vaughn 12/29/2017, 9:30 PM

## 2017-12-29 NOTE — Progress Notes (Signed)
Patient ID: Larry Vaughn, male   DOB: Oct 30, 1980, 37 y.o.   MRN: [id number] Per State regulations 482.30 this chart was reviewed for medical necessity with respect to the patient's admission/duration of stay.    Next review date: 01/02/18  Thurman CoyerEric Denvil Canning, BSN, RN-BC  Case Manager

## 2017-12-29 NOTE — Consult Note (Signed)
  Mr. Larry Vaughn will be admitted to psychiatry. Orders in place. Full note to follow.

## 2017-12-30 DIAGNOSIS — F203 Undifferentiated schizophrenia: Principal | ICD-10-CM

## 2017-12-30 LAB — LIPID PANEL
CHOL/HDL RATIO: 3.4 ratio
Cholesterol: 203 mg/dL — ABNORMAL HIGH (ref 0–200)
HDL: 59 mg/dL (ref >40)
LDL Cholesterol: 130 mg/dL — ABNORMAL HIGH (ref 0–99)
Triglycerides: 69 mg/dL (ref <150)
VLDL: 14 mg/dL (ref 0–40)

## 2017-12-30 LAB — TSH: TSH: 1.829 u[IU]/mL (ref 0.350–4.500)

## 2017-12-30 LAB — HEMOGLOBIN A1C
Hgb A1c MFr Bld: 5.3 % (ref 4.8–5.6)
Mean Plasma Glucose: 105.41 mg/dL

## 2017-12-30 NOTE — Plan of Care (Signed)
Patient is progressing towards some goals.   Problem: Coping: Goal: Coping ability will improve Outcome: Progressing Goal: Will verbalize feelings Outcome: Progressing   Problem: Safety: Goal: Ability to remain free from injury will improve Outcome: Progressing   Problem: Activity: Goal: Risk for activity intolerance will decrease Outcome: Progressing   Problem: Nutrition: Goal: Adequate nutrition will be maintained Outcome: Progressing   Problem: Coping: Goal: Level of anxiety will decrease Outcome: Progressing   Problem: Pain Managment: Goal: General experience of comfort will improve Outcome: Progressing   Problem: Safety: Goal: Ability to remain free from injury will improve Outcome: Progressing   Problem: Skin Integrity: Goal: Risk for impaired skin integrity will decrease Outcome: Progressing

## 2017-12-30 NOTE — Progress Notes (Signed)
Patient ID: Larry Vaughn, male   DOB: 1981-05-12, 37 y.o.   MRN: [id number] Isolative to room, spontaneous eyes opening at prompt, A&Ox2, re-oriented to time (day & date), denied pain, unwilling to participate in anything, denied SI/HI/AVH, poor hygiene, encouraged to shower, ignored request, stayed in bed.

## 2017-12-30 NOTE — H&P (Signed)
Psychiatric Admission Assessment Adult  Patient Identification: Larry StarchKelly D Samad MRN:  [id number] Date of Evaluation:  12/30/2017 Chief Complaint:  Schizophrenia Principal Diagnosis: Undifferentiated schizophrenia (HCC) Diagnosis:   Patient Active Problem List   Diagnosis Date Noted  . Undifferentiated schizophrenia (HCC) [F20.3] 12/29/2017    Priority: High  . Cannabis use disorder, moderate, dependence (HCC) [F12.20] 12/29/2017  . Alcohol use disorder, moderate, dependence (HCC) [F10.20] 12/29/2017  . Tobacco use disorder [F17.200] 12/29/2017  . Intellectual disability [F79] 12/29/2017  . History of psychiatric disorder [Z86.59] 06/22/2013  . Respiratory failure (HCC) [J96.90] 06/21/2013  . Altered mental status [R41.82] 06/21/2013  . Alcohol intoxication with blood level over 0.3 [R78.0] 06/21/2013  . Acute respiratory failure (HCC) [J96.00] 06/21/2013   History of Present Illness:   Identifying data. Mr. Larry Vaughn is a 37 year old male with a history of psychosis and substance abuse.  Chief complaint. "[name] is nothing wrong with me."  History of present illness. Information was obtained from the patient and the chart. The patient was brought to the ER by the police arrested for stalking a neighbor who already has 50C on him. The patient reports that, while in his parents house, he could hear this woman calling him, have sex-talk with him and inviting him over to her house under the pretense that she needed a questionnaire. He never saw her but could clearly hear her voice.He went to her house, she asked him to leave and called the police. The patient denies any symptoms of depression, anxiety or psychosis but during longer conversation admits that he was diagnosed and disabled from schizophrenia and that he does hear voices "sometimes". Unfortunately, he seems to be obsessed with the neighbor. He was surprised that she did not want to have sex with him. He admits that he has not been  compliant with treatment because he is unable to afford medications. He also has been drinking "four beers".   Past psychiatric history. He has developmental disability and schizophrenia. He has been off medications since he was released from prison. Apparently, receive d medication in prison. He has been [facility] for psychosis and alcohol related problems.  Family psychiatric history. None reported.  Social history. He was in special education classes. He was in prison for 6 years. Following release, he has not been getting his disability check. He lives with his parents. He does have Medicaid.   Total Time spent with patient: 45 minutes  Is the patient at risk to self? No.  Has the patient been a risk to self in the past 6 months? No.  Has the patient been a risk to self within the distant past? No.  Is the patient a risk to others? No.  Has the patient been a risk to others in the past 6 months? No.  Has the patient been a risk to others within the distant past? No.   Prior Inpatient Therapy:   Prior Outpatient Therapy:    Alcohol Screening: 1. How often do you have a drink containing alcohol?: Monthly or less 2. How many drinks containing alcohol do you have on a typical day when you are drinking?: 1 or 2 3. How often do you have six or more drinks on one occasion?: Less than monthly AUDIT-C Score: 2 4. How often during the last year have you found that you were not able to stop drinking once you had started?: Less than monthly 5. How often during the last year have you failed to do  what was normally expected from you becasue of drinking?: Less than monthly 6. How often during the last year have you needed a first drink in the morning to get yourself going after a heavy drinking session?: Less than monthly 7. How often during the last year have you had a feeling of guilt of remorse after drinking?: Less than monthly 8. How often during the last year have you been  unable to remember what happened the night before because you had been drinking?: Less than monthly 9. Have you or someone else been injured as a result of your drinking?: No 10. Has a relative or friend or a doctor or another health worker been concerned about your drinking or suggested you cut down?: No Alcohol Use Disorder Identification Test Final Score (AUDIT): 7 Substance Abuse History in the last 12 months:  Yes.   Consequences of Substance Abuse: Negative Previous Psychotropic Medications: Yes  Psychological Evaluations: No  Past Medical History:  Past Medical History:  Diagnosis Date  . Schizophrenia (HCC)   . Seizures (HCC)    History reviewed. No pertinent surgical history. Family History: History reviewed. No pertinent family history.  Tobacco Screening: Have you used any form of tobacco in the last 30 days? (Cigarettes, Smokeless Tobacco, Cigars, and/or Pipes): Yes Tobacco use, Select all that apply: 5 or more cigarettes per day Are you interested in Tobacco Cessation Medications?: Yes, will notify MD for an order Counseled patient on smoking cessation including recognizing danger situations, developing coping skills and basic information about quitting provided: Yes Social History:  Social History   Substance and Sexual Activity  Alcohol Use Yes   Comment: occ     Social History   Substance and Sexual Activity  Drug Use Yes  . Types: Marijuana    Additional Social History:                           Allergies:   Allergies  Allergen Reactions  . Other     Pt says is allergic to a medication but doesn't know what it is.  Pt says when he took it, he had stiffness in his body and was unable to walk    Lab Results:  Results for orders placed or performed during the hospital encounter of 12/29/17 (from the past 48 hour(s))  Hemoglobin A1c     Status: None   Collection Time: 12/29/17  9:31 AM  Result Value Ref Range   Hgb A1c MFr Bld 5.3 4.8 - 5.6 %     Comment: (NOTE) Pre diabetes:          5.7%-6.4% Diabetes:              >6.4% Glycemic control for   <7.0% adults with diabetes    Mean Plasma Glucose 105.41 mg/dL    Comment: Performed at [facility], [address], [place], [place] [address]  Lipid panel     Status: Abnormal   Collection Time: 12/29/17  9:31 AM  Result Value Ref Range   Cholesterol 203 (H) 0 - 200 mg/dL   Triglycerides 69 <657 mg/dL   HDL 59 >84 mg/dL   Total CHOL/HDL Ratio 3.4 RATIO   VLDL 14 0 - 40 mg/dL   LDL Cholesterol 696 (H) 0 - 99 mg/dL    Comment:        Total Cholesterol/HDL:CHD Risk Coronary Heart Disease Risk Table  Men   Women  1/2 Average Risk   3.4   3.3  Average Risk       5.0   4.4  2 X Average Risk   9.6   7.1  3 X Average Risk  23.4   11.0        Use the calculated Patient Ratio above and the CHD Risk Table to determine the patient's CHD Risk.        ATP III CLASSIFICATION (LDL):  <100     mg/dL   Optimal  409-811  mg/dL   Near or Above                    Optimal  130-159  mg/dL   Borderline  914-782  mg/dL   High  >956     mg/dL   Very High Performed at [facility], [address] [place], [place] [address]   TSH     Status: None   Collection Time: 12/29/17  9:31 AM  Result Value Ref Range   TSH 1.829 0.350 - 4.500 uIU/mL    Comment: Performed by a 3rd Generation assay with a functional sensitivity of <=0.01 uIU/mL. Performed at [facility], [address] [place], [place] [address]     Blood Alcohol level:  Lab Results  Component Value Date   ETH 52 (H) 12/29/2017   ETH 312 (H) 06/21/2013    Metabolic Disorder Labs:  Lab Results  Component Value Date   HGBA1C 5.3 12/29/2017   MPG 105.41 12/29/2017   No results found for: PROLACTIN Lab Results  Component Value Date   CHOL 203 (H) 12/29/2017   TRIG 69 12/29/2017   HDL 59 12/29/2017   CHOLHDL 3.4 12/29/2017   VLDL 14 12/29/2017   LDLCALC 130 (H) 12/29/2017     Current Medications: Current Facility-Administered Medications  Medication Dose Route Frequency Provider Last Rate Last Dose  . acetaminophen (TYLENOL) tablet 650 mg  650 mg Oral Q6H PRN Batu Cassin B, MD      . alum & mag hydroxide-simeth (MAALOX/MYLANTA) 200-200-20 MG/5ML suspension 30 mL  30 mL Oral Q4H PRN Thoma Paulsen B, MD      . divalproex (DEPAKOTE) DR tablet 500 mg  500 mg Oral Q8H Bridgit Eynon B, MD   500 mg at 12/30/17 0636  . docusate sodium (COLACE) capsule 100 mg  100 mg Oral Q12H Prue Lingenfelter B, MD   100 mg at 12/30/17 0839  . hydrOXYzine (ATARAX/VISTARIL) tablet 50 mg  50 mg Oral TID PRN Arris Meyn B, MD      . magnesium hydroxide (MILK OF MAGNESIA) suspension 30 mL  30 mL Oral Daily PRN Leovardo Thoman B, MD      . nicotine (NICODERM CQ - dosed in mg/24 hours) patch 21 mg  21 mg Transdermal Q0600 Danella Philson B, MD   21 mg at 12/30/17 0636  . paliperidone (INVEGA) 24 hr tablet 6 mg  6 mg Oral Daily Ashtian Villacis B, MD   6 mg at 12/30/17 0839  . traZODone (DESYREL) tablet 100 mg  100 mg Oral QHS Daimon Kean B, MD   100 mg at 12/29/17 2214   PTA Medications: Medications Prior to Admission  Medication Sig Dispense Refill Last Dose  . docusate sodium (COLACE) 100 MG capsule Take 1 capsule (100 mg total) by mouth every 12 (twelve) hours. 60 capsule 0     Musculoskeletal: Strength & Muscle Tone: within normal limits Gait &  Station: normal Patient leans: N/A  Psychiatric Specialty Exam: Physical Exam  Nursing note and vitals reviewed. Constitutional: He is oriented to person, place, and time. He appears well-developed and well-nourished.  HENT:  Head: Normocephalic and atraumatic.  Eyes: Pupils are equal, round, and reactive to light. Conjunctivae and EOM are normal.  Neck: Normal range of motion. Neck supple.  Cardiovascular: Normal rate, regular rhythm and normal heart sounds.  Respiratory: Effort normal  and breath sounds normal.  GI: Soft. Bowel sounds are normal.  Musculoskeletal: Normal range of motion.  Neurological: He is alert and oriented to person, place, and time.  Skin: Skin is warm and dry.  Psychiatric: His affect is angry and inappropriate. He is actively hallucinating. Thought content is paranoid and delusional. Cognition and memory are impaired. He expresses impulsivity.    Review of Systems  Neurological: Negative.   Psychiatric/Behavioral: Positive for hallucinations and substance abuse.  All other systems reviewed and are negative.   Blood pressure 110/72, pulse 73, temperature (!) 97.5 F (36.4 C), temperature source Oral, resp. rate 16, height 5\' 7"  (1.702 m), weight 56.2 kg (124 lb), SpO2 100 %.Body mass index is 19.42 kg/m.  See SRA                                                  Sleep:  Number of Hours: 6.5    Treatment Plan Summary: Daily contact with patient to assess and evaluate symptoms and progress in treatment and Medication management   Mr. Rigg is a 37 year old male with a history of psychosis, mood instability and substance abuse admitted for psychotic bereak.  #Mood/psychosis -start Invega 6 mg daily, increase to 12 mg daily -offer Invega sustenna injections -Start Depakote 500 mg TID -Trazodone 100 mg nightly  #Substance abuse -positive for cannabis and alcohol -VS are stable, denies symptoms of withdrawal, will monitor -minimizes problems and declines treatment  #Labs -lipid panel, TSH and A1C  -EKG  #Disposition -discharge with family -follow up with a local provider   Observation Level/Precautions:  15 minute checks  Laboratory:  CBC Chemistry Profile UDS UA  Psychotherapy:    Medications:    Consultations:    Discharge Concerns:    Estimated LOS:  Other:     Physician Treatment Plan for Primary Diagnosis: Undifferentiated schizophrenia (HCC) Long Term Goal(s): Improvement in symptoms so as ready  for discharge  Short Term Goals: Ability to identify changes in lifestyle to reduce recurrence of condition will improve, Ability to verbalize feelings will improve, Ability to disclose and discuss suicidal ideas, Ability to demonstrate self-control will improve, Ability to identify and develop effective coping behaviors will improve, Ability to maintain clinical measurements within normal limits will improve, Compliance with prescribed medications will improve and Ability to identify triggers associated with substance abuse/mental health issues will improve  Physician Treatment Plan for Secondary Diagnosis: Principal Problem:   Undifferentiated schizophrenia (HCC) Active Problems:   Cannabis use disorder, moderate, dependence (HCC)   Alcohol use disorder, moderate, dependence (HCC)   Tobacco use disorder   Intellectual disability  Long Term Goal(s): Improvement in symptoms so as ready for discharge  Short Term Goals: Ability to identify changes in lifestyle to reduce recurrence of condition will improve, Ability to demonstrate self-control will improve and Ability to identify triggers associated with substance abuse/mental health issues will improve  I certify  that inpatient services furnished can reasonably be expected to improve the patient's condition.    Kristine Linea, MD 6/11/20199:29 AM

## 2017-12-30 NOTE — BHH Suicide Risk Assessment (Signed)
[facility] Admission Suicide Risk Assessment   Nursing information obtained from:  Patient Demographic factors:  Low socioeconomic status Current Mental Status:  Thoughts of violence towards others Loss Factors:  NA Historical Factors:  NA Risk Reduction Factors:  Positive therapeutic relationship  Total Time spent with patient: 1 hour Principal Problem: Undifferentiated schizophrenia (HCC) Diagnosis:   Patient Active Problem List   Diagnosis Date Noted  . Undifferentiated schizophrenia (HCC) [F20.3] 12/29/2017    Priority: High  . Cannabis use disorder, moderate, dependence (HCC) [F12.20] 12/29/2017  . Alcohol use disorder, moderate, dependence (HCC) [F10.20] 12/29/2017  . Tobacco use disorder [F17.200] 12/29/2017  . Intellectual disability [F79] 12/29/2017  . History of psychiatric disorder [Z86.59] 06/22/2013  . Respiratory failure (HCC) [J96.90] 06/21/2013  . Altered mental status [R41.82] 06/21/2013  . Alcohol intoxication with blood level over 0.3 [R78.0] 06/21/2013  . Acute respiratory failure (HCC) [J96.00] 06/21/2013   Subjective Data: psychotic break.  Continued Clinical Symptoms:  Alcohol Use Disorder Identification Test Final Score (AUDIT): 7 The "Alcohol Use Disorders Identification Test", Guidelines for Use in Primary Care, Second Edition.  World Science writer [facility]). Score between 0-7:  no or low risk or alcohol related problems. Score between 8-15:  moderate risk of alcohol related problems. Score between 16-19:  high risk of alcohol related problems. Score 20 or above:  warrants further diagnostic evaluation for alcohol dependence and treatment.   CLINICAL FACTORS:   Bipolar Disorder:   Mixed State Alcohol/Substance Abuse/Dependencies   Musculoskeletal: Strength & Muscle Tone: within normal limits Gait & Station: normal Patient leans: N/A  Psychiatric Specialty Exam: Physical Exam  Nursing note and vitals reviewed. Psychiatric: His affect is angry and  inappropriate. His speech is rapid and/or pressured. He is actively hallucinating. Thought content is paranoid and delusional. Cognition and memory are impaired. He expresses impulsivity.    Review of Systems  Neurological: Negative.   Psychiatric/Behavioral: Positive for hallucinations and substance abuse. The patient has insomnia.   All other systems reviewed and are negative.   Blood pressure 110/72, pulse 73, temperature (!) 97.5 F (36.4 C), temperature source Oral, resp. rate 16, height 5\' 7"  (1.702 m), weight 56.2 kg (124 lb), SpO2 100 %.Body mass index is 19.42 kg/m.  General Appearance: Fairly Groomed  Eye Contact:  Good  Speech:  Pressured  Volume:  Increased  Mood:  Dysphoric and Irritable  Affect:  Congruent  Thought Process:  Goal Directed and Descriptions of Associations: Intact  Orientation:  Full (Time, Place, and Person)  Thought Content:  Delusions, Hallucinations: Auditory and Paranoid Ideation  Suicidal Thoughts:  No  Homicidal Thoughts:  No  Memory:  Immediate;   Poor Recent;   Poor Remote;   Poor  Judgement:  Poor  Insight:  Lacking  Psychomotor Activity:  Increased  Concentration:  Concentration: Poor and Attention Span: Poor  Recall:  Poor  Fund of Knowledge:  Poor  Language:  Fair  Akathisia:  No  Handed:  Right  AIMS (if indicated):     Assets:  Communication Skills Desire for Improvement Housing Physical Health Resilience Social Support  ADL's:  Intact  Cognition:  WNL  Sleep:  Number of Hours: 6.5      COGNITIVE FEATURES THAT CONTRIBUTE TO RISK:  None    SUICIDE RISK:   Minimal: No identifiable suicidal ideation.  Patients presenting with no risk factors but with morbid ruminations; may be classified as minimal risk based on the severity of the depressive symptoms  PLAN OF CARE: hospital admission,  medication management, substance abuse counseling, discharge planning.  Larry Vaughn is a 37 year old male with a history of psychosis, mood  instability and substance abuse admitted for psychotic bereak.  #Mood/psychosis -start Invega 6 mg daily, increase to 12 mg daily -offer Invega sustenna injections -Start Depakote 500 mg TID -Trazodone 100 mg nightly  #Substance abuse -positive for cannabis -minimizes problems and declines treatment  #Labs -lipid panel, TSH and A1C  -EKG  #Disposition -discharge with family -follow up with a local provider  I certify that inpatient services furnished can reasonably be expected to improve the patient's condition.   Larry LineaJolanta Damondre Pfeifle, MD 12/30/2017, 9:13 AM

## 2017-12-30 NOTE — Progress Notes (Signed)
Recreation Therapy Notes   Date: 12/29/2017  Time: 9:30 am  Location: Craft Room  Behavioral response: Appropriate   Intervention Topic: Stress  Discussion/Intervention:  Group content on today was focused on stress. The group defined stress and ways to cope with stress. Participants expressed how they know when they are stresses out and certain triggers that stress them out. Individuals described the different ways they have to cope with stress. The group stated reasons why it is important to cope with stress. Patient explained what good stress is and some examples. The group participated in the intervention "Managing Stress". Individuals were able to identify new ways to cope with stress.  Clinical Observations/Feedback:  Patient came to group and was focused on what peers and staff had to say about stress. Individual participated in the intervention and was social with peers and staff during group. Tomekia Helton LRT/CTRS         Jahmar Mckelvy 12/30/2017 11:19 AM

## 2017-12-30 NOTE — Progress Notes (Signed)
Patient is calm and cooperative as he engages this Clinical research associatewriter. He smiles at this Clinical research associatewriter and reports he feels "good", and denies depression as well as SI/HI/AVH. He is observed interacting appropriately with peers and staff. He is also observed eating meals well with fluids. Emotional support provided. Will continue to monitor for care and safety.

## 2017-12-30 NOTE — Progress Notes (Signed)
New admit IVC  Due to ineffective coping ability delusional and hearing voices and non compliant with his medicines,contract for safety and no contraband found , skin is clean two RN search Alex/Bukola, patient is cooperative , educate patient on safety and nutrition , acknowledge , no distress .

## 2017-12-30 NOTE — BHH Group Notes (Signed)
CSW Group Therapy Note  12/30/2017  Time:  0900  Type of Therapy and Topic: Group Therapy: Goals Group: SMART Goals    Participation Level:  Did Not Attend    Description of Group:   The purpose of a daily goals group is to assist and guide patients in setting recovery/wellness-related goals. The objective is to set goals as they relate to the crisis in which they were admitted. Patients will be using SMART goal modalities to set measurable goals. Characteristics of realistic goals will be discussed and patients will be assisted in setting and processing how one will reach their goal. Facilitator will also assist patients in applying interventions and coping skills learned in psycho-education groups to the SMART goal and process how one will achieve defined goal.    Therapeutic Goals:  -Patients will develop and document one goal related to or their crisis in which brought them into treatment.  -Patients will be guided by LCSW using SMART goal setting modality in how to set a measurable, attainable, realistic and time sensitive goal.  -Patients will process barriers in reaching goal.  -Patients will process interventions in how to overcome and successful in reaching goal.    Patient's Goal:  Pt was invited to attend group but chose not to attend. CSW will continue to encourage pt to attend group throughout their admission.     Therapeutic Modalities:  Motivational Interviewing  Cognitive Behavioral Therapy  Crisis Intervention Model  SMART goals setting  Heidi DachKelsey Gari Trovato, MSW, LCSW Clinical Social Worker 12/30/2017 9:35 AM

## 2017-12-30 NOTE — BHH Group Notes (Signed)
12/30/2017 1PM  Type of Therapy/Topic:  Group Therapy:  Feelings about Diagnosis  Participation Level:  Active   Description of Group:   This group will allow patients to explore their thoughts and feelings about diagnoses they have received. Patients will be guided to explore their level of understanding and acceptance of these diagnoses. Facilitator will encourage patients to process their thoughts and feelings about the reactions of others to their diagnosis and will guide patients in identifying ways to discuss their diagnosis with significant others in their lives. This group will be process-oriented, with patients participating in exploration of their own experiences, giving and receiving support, and processing challenge from other group members.   Therapeutic Goals: 1. Patient will demonstrate understanding of diagnosis as evidenced by identifying two or more symptoms of the disorder 2. Patient will be able to express two feelings regarding the diagnosis 3. Patient will demonstrate their ability to communicate their needs through discussion and/or role play  Summary of Patient Progress: Actively and appropriately engaged in the group. Patient was able to provide support and validation to other group members.Patient practiced active listening when interacting with the facilitator and other group members. Larry Vaughn mentioned how he felt "carelesss, like no one cared about me" when he was initially diagnosed. He did well in the group activity as a listener. Patient is still in the process of obtaining treatment goals.        Therapeutic Modalities:   Cognitive Behavioral Therapy Brief Therapy Feelings Identification    Larry ShearsCassandra  Larry Erhard, LCSW 12/30/2017 2:09 PM

## 2017-12-31 MED ORDER — PALIPERIDONE ER 3 MG PO TB24
9.0000 mg | ORAL_TABLET | Freq: Every day | ORAL | Status: DC
  Administered 2018-01-01 – 2018-01-02 (×2): 9 mg via ORAL
  Filled 2017-12-31 (×2): qty 3

## 2017-12-31 MED ORDER — PALIPERIDONE PALMITATE ER 234 MG/1.5ML IM SUSY
234.0000 mg | PREFILLED_SYRINGE | INTRAMUSCULAR | Status: DC
  Administered 2017-12-31: 234 mg via INTRAMUSCULAR
  Filled 2017-12-31: qty 1.5

## 2017-12-31 NOTE — Progress Notes (Signed)
Recreation Therapy Notes  INPATIENT RECREATION THERAPY ASSESSMENT  Patient Details Name: Larry Vaughn MRN: [id number] DOB: [birth year] Today's Date: 12/31/2017       Information Obtained From: Patient  Able to Participate in Assessment/Interview: Yes  Patient Presentation: Responsive  Reason for Admission (Per Patient): Active Symptoms  Patient Stressors:    Coping Skills:   Isolation, Avoidance, Other (Comment)(Try not to get mad)  Leisure Interests (2+):  (Mow yard, [employer]ash cars)  Frequency of Recreation/Participation: Chief Executive OfficerMonthly  Awareness of Community Resources:  No  Community Resources:     Current Use:    If no, Barriers?:    Expressed Interest in [employer]Community Resource Information:    [place]  Patient Main Form of Transportation: Other (Comment)(My parents takes me around)  Patient Strengths:  Staying to myself  Patient Identified Areas of Improvement:  Stay out of court and hospital  Patient Goal for Hospitalization:  Stay off medication  Current SI (including self-harm):  No  Current HI:  No  Current AVH: No  Staff Intervention Plan: Group Attendance, Collaborate with Interdisciplinary Treatment Team  Consent to Intern Participation: N/A  Larry Vaughn 12/31/2017, 2:06 PM

## 2017-12-31 NOTE — Tx Team (Addendum)
Interdisciplinary Treatment and Diagnostic Plan Update  12/31/2017 Time of Session: 10:40 AM Larry Vaughn MRN: [id number]  Principal Diagnosis: Undifferentiated schizophrenia [facility])  Secondary Diagnoses: Principal Problem:   Undifferentiated schizophrenia (HCC) Active Problems:   Cannabis use disorder, moderate, dependence (HCC)   Alcohol use disorder, moderate, dependence (HCC)   Tobacco use disorder   Intellectual disability   Current Medications:  Current Facility-Administered Medications  Medication Dose Route Frequency Provider Last Rate Last Dose  . acetaminophen (TYLENOL) tablet 650 mg  650 mg Oral Q6H PRN Pucilowska, Jolanta B, MD      . alum & mag hydroxide-simeth (MAALOX/MYLANTA) 200-200-20 MG/5ML suspension 30 mL  30 mL Oral Q4H PRN Pucilowska, Jolanta B, MD      . divalproex (DEPAKOTE) DR tablet 500 mg  500 mg Oral Q8H Pucilowska, Jolanta B, MD   500 mg at 12/31/17 1522  . docusate sodium (COLACE) capsule 100 mg  100 mg Oral Q12H Pucilowska, Jolanta B, MD   100 mg at 12/31/17 0940  . hydrOXYzine (ATARAX/VISTARIL) tablet 50 mg  50 mg Oral TID PRN Pucilowska, Jolanta B, MD      . magnesium hydroxide (MILK OF MAGNESIA) suspension 30 mL  30 mL Oral Daily PRN Pucilowska, Jolanta B, MD      . nicotine (NICODERM CQ - dosed in mg/24 hours) patch 21 mg  21 mg Transdermal Q0600 Pucilowska, Jolanta B, MD   21 mg at 12/31/17 0624  . paliperidone (INVEGA SUSTENNA) injection 234 mg  234 mg Intramuscular Q28 days Pucilowska, Jolanta B, MD   234 mg at 12/31/17 1255  . [START ON 01/01/2018] paliperidone (INVEGA) 24 hr tablet 9 mg  9 mg Oral Daily Pucilowska, Jolanta B, MD      . traZODone (DESYREL) tablet 100 mg  100 mg Oral QHS Pucilowska, Jolanta B, MD   100 mg at 12/30/17 2151   PTA Medications: Medications Prior to Admission  Medication Sig Dispense Refill Last Dose  . docusate sodium (COLACE) 100 MG capsule Take 1 capsule (100 mg total) by mouth every 12 (twelve) hours. 60 capsule 0      Patient Stressors: Financial difficulties Medication change or noncompliance Substance abuse  Patient Strengths: Capable of independent living Barrister's clerk for treatment/growth Special hobby/interest Supportive family/friends  Treatment Modalities: Medication Management, Group therapy, Case management,  1 to 1 session with clinician, Psychoeducation, Recreational therapy.   Physician Treatment Plan for Primary Diagnosis: Undifferentiated schizophrenia (HCC) Long Term Goal(s): Improvement in symptoms so as ready for discharge Improvement in symptoms so as ready for discharge   Short Term Goals: Ability to identify changes in lifestyle to reduce recurrence of condition will improve Ability to verbalize feelings will improve Ability to disclose and discuss suicidal ideas Ability to demonstrate self-control will improve Ability to identify and develop effective coping behaviors will improve Ability to maintain clinical measurements within normal limits will improve Compliance with prescribed medications will improve Ability to identify triggers associated with substance abuse/mental health issues will improve Ability to identify changes in lifestyle to reduce recurrence of condition will improve Ability to demonstrate self-control will improve Ability to identify triggers associated with substance abuse/mental health issues will improve  Medication Management: Evaluate patient's response, side effects, and tolerance of medication regimen.  Therapeutic Interventions: 1 to 1 sessions, Unit Group sessions and Medication administration.  Evaluation of Outcomes: Progressing  Physician Treatment Plan for Secondary Diagnosis: Principal Problem:   Undifferentiated schizophrenia (HCC) Active Problems:   Cannabis use disorder, moderate, dependence (HCC)  Alcohol use disorder, moderate, dependence (HCC)   Tobacco use disorder   Intellectual disability  Long Term  Goal(s): Improvement in symptoms so as ready for discharge Improvement in symptoms so as ready for discharge   Short Term Goals: Ability to identify changes in lifestyle to reduce recurrence of condition will improve Ability to verbalize feelings will improve Ability to disclose and discuss suicidal ideas Ability to demonstrate self-control will improve Ability to identify and develop effective coping behaviors will improve Ability to maintain clinical measurements within normal limits will improve Compliance with prescribed medications will improve Ability to identify triggers associated with substance abuse/mental health issues will improve Ability to identify changes in lifestyle to reduce recurrence of condition will improve Ability to demonstrate self-control will improve Ability to identify triggers associated with substance abuse/mental health issues will improve     Medication Management: Evaluate patient's response, side effects, and tolerance of medication regimen.  Therapeutic Interventions: 1 to 1 sessions, Unit Group sessions and Medication administration.  Evaluation of Outcomes: Progressing   RN Treatment Plan for Primary Diagnosis: Undifferentiated schizophrenia (HCC) Long Term Goal(s): Knowledge of disease and therapeutic regimen to maintain health will improve  Short Term Goals: Ability to remain free from injury will improve, Ability to verbalize feelings will improve, Ability to identify and develop effective coping behaviors will improve and Compliance with prescribed medications will improve  Medication Management: RN will administer medications as ordered by provider, will assess and evaluate patient's response and provide education to patient for prescribed medication. RN will report any adverse and/or side effects to prescribing provider.  Therapeutic Interventions: 1 on 1 counseling sessions, Psychoeducation, Medication administration, Evaluate responses to  treatment, Monitor vital signs and CBGs as ordered, Perform/monitor CIWA, COWS, AIMS and Fall Risk screenings as ordered, Perform wound care treatments as ordered.  Evaluation of Outcomes: Progressing   LCSW Treatment Plan for Primary Diagnosis: Undifferentiated schizophrenia (HCC) Long Term Goal(s): Safe transition to appropriate next level of care at discharge, Engage patient in therapeutic group addressing interpersonal concerns.  Short Term Goals: Engage patient in aftercare planning with referrals and resources and Increase skills for wellness and recovery  Therapeutic Interventions: Assess for all discharge needs, 1 to 1 time with Social worker, Explore available resources and support systems, Assess for adequacy in community support network, Educate family and significant other(s) on suicide prevention, Complete Psychosocial Assessment, Interpersonal group therapy.  Evaluation of Outcomes: Progressing   Progress in Treatment: Attending groups: Yes. Participating in groups: No. Taking medication as prescribed: Yes. Toleration medication: Yes. Family/Significant other contact made: No, will contact:  CSW given consent to contact pt's parents. Patient understands diagnosis: Yes. Discussing patient identified problems/goals with staff: Yes. Medical problems stabilized or resolved: Yes. Denies suicidal/homicidal ideation: Yes. Issues/concerns per patient self-inventory: No. Other: n/a  New problem(s) identified: No, Describe:  No new problems identified  New Short Term/Long Term Goal(s):  Patient Goals:  "get my disability back so I can get my own place.  I need to get these legal charges taken care of"  Discharge Plan or Barriers: Tentative plan for pt to return to his parent's home with follow-up services with [facility] in [place], [place].  Reason for Continuation of Hospitalization: Anxiety Depression Medication stabilization  Estimated Length of Stay: 3-5 days  Recreational  Therapy: Patient Stressors: N/A Patient Goal: Patient will identify 3 triggers to anxiety within 5 recreation therapy group sessions  Attendees: Patient: Larry Vaughn 12/31/2017 5:14 PM  Physician: Kristine Linea, MD 12/31/2017 5:14 PM  Nursing: Hulan AmatoGwen Farrish, RN 12/31/2017 5:14 PM  RN Care Manager: 12/31/2017 5:14 PM  Social Worker: Huey RomansSonya Carter, LCSW 12/31/2017 5:14 PM  Recreational Therapist: Garret ReddishShay Kayvon Mo, LRT 12/31/2017 5:14 PM  Other: Heidi DachKelsey Craig, LCSW 12/31/2017 5:14 PM  Other: Johny Shearsassandra Jarrett, LCSWA 12/31/2017 5:14 PM  Other: Damian LeavellJohn Mullins, Chaplain 12/31/2017 5:14 PM    Scribe for Treatment Team: Alease FrameSonya S Carter, LCSW 12/31/2017 5:14 PM

## 2017-12-31 NOTE — Progress Notes (Signed)
Recreation Therapy Notes   Date: 12/31/2017  Time: 9:30 am  Location: Craft Room  Behavioral response: Appropriate   Intervention Topic: Communication  Discussion/Intervention:  Group content today was focused on communication. The group defined communication and ways to communicate with others. Individuals stated reason why communication is important and some reasons to communicate with others. Patients expressed if they thought they were good at communicating with others and ways they could improve their communication skills. The group identified important parts of communication and some experiences they have had in the past with communication. The group participated in the intervention "What is that?", where they had a chance to test out their communication skills and identify ways to improve their communication techniques.   Clinical Observations/Feedback:  Patient came to group late due to unknown reasons. He participated in the intervention and was social with peers and staff during group.             Everli Rother 12/31/2017 12:43 PM

## 2017-12-31 NOTE — [facility] Group Notes (Signed)
[facility] Group Notes:  (Nursing/MHT/Case Management/Adjunct)  Date:  12/31/2017  Time:  9:38 PM  Type of Therapy:  Group Therapy  Participation Level:  Active  Participation Quality:  Appropriate  Affect:  Appropriate  Cognitive:  Appropriate  Insight:  Appropriate  Engagement in Group:  Engaged  Modes of Intervention:  Discussion  Summary of Progress/Problems:  Larry [name] [name] 12/31/2017, 9:38 PM

## 2017-12-31 NOTE — Plan of Care (Signed)
Patient denies SI/HI/AVH at this time and has verbalized understanding of the general information that has been provided to him, and all questions/concerns have been addressed and answered at this time. Patient has the ability to cope and has been observed outside with staff and the other members on the unit without any issues at this time. Patient also denies any signs/symptoms of depression/anxiety at the time of assessment. Patient has the ability to identify the changes in his lifestyle to reduce recurrence of his condition as well as the available resources that can help assist him in meeting his healthcare needs. Patient has remained free from injury thus far and patient remains safe on the unit at this time.  Problem: Education: Goal: Will be free of psychotic symptoms Outcome: Progressing Goal: Knowledge of the prescribed therapeutic regimen will improve Outcome: Progressing   Problem: Coping: Goal: Coping ability will improve Outcome: Progressing Goal: Will verbalize feelings Outcome: Progressing   Problem: Coping: Goal: Coping ability will improve Outcome: Progressing Goal: Will verbalize feelings Outcome: Progressing   Problem: Health Behavior/Discharge Planning: Goal: Ability to identify changes in lifestyle to reduce recurrence of condition will improve Outcome: Progressing Goal: Identification of resources available to assist in meeting health care needs will improve Outcome: Progressing   Problem: Safety: Goal: Ability to remain free from injury will improve Outcome: Progressing   Problem: Education: Goal: Knowledge of General Education information will improve Outcome: Progressing   Problem: Health Behavior/Discharge Planning: Goal: Ability to manage health-related needs will improve Outcome: Progressing   Problem: Safety: Goal: Ability to remain free from injury will improve Outcome: Progressing

## 2017-12-31 NOTE — Progress Notes (Signed)
[facility] MD Progress Note  12/31/2017 9:33 AM Larry Vaughn  MRN:  [id number]  Subjective:    Larry Vaughn is up this morning ready to go to class. He denies any symptoms of depression, anxiety or psychosis. He does not hear his neighbor sex-talking to him. He accepts medications and tolerates them well. He agrees to [place] sustenna injection but makes a point the "when he goes to prison, he will not be given medications". He does have several charges pending and was incarcerated for 6 years before. It is unclear if he is a habitual felon at this point.   Principal Problem: Undifferentiated schizophrenia (HCC) Diagnosis:   Patient Active Problem List   Diagnosis Date Noted  . Undifferentiated schizophrenia (HCC) [F20.3] 12/29/2017    Priority: High  . Cannabis use disorder, moderate, dependence (HCC) [F12.20] 12/29/2017  . Alcohol use disorder, moderate, dependence (HCC) [F10.20] 12/29/2017  . Tobacco use disorder [F17.200] 12/29/2017  . Intellectual disability [F79] 12/29/2017  . History of psychiatric disorder [Z86.59] 06/22/2013  . Respiratory failure (HCC) [J96.90] 06/21/2013  . Altered mental status [R41.82] 06/21/2013  . Alcohol intoxication with blood level over 0.3 [R78.0] 06/21/2013  . Acute respiratory failure (HCC) [J96.00] 06/21/2013   Total Time spent with patient: 20 minutes  Past Psychiatric History: schizophrenia  Past Medical History:  Past Medical History:  Diagnosis Date  . Schizophrenia (HCC)   . Seizures (HCC)    History reviewed. No pertinent surgical history. Family History: History reviewed. No pertinent family history. Family Psychiatric  History: none Social History:  Social History   Substance and Sexual Activity  Alcohol Use Yes   Comment: occ     Social History   Substance and Sexual Activity  Drug Use Yes  . Types: Marijuana    Social History   Socioeconomic History  . Marital status: Single    Spouse name: Not on file  . Number of children:  Not on file  . Years of education: Not on file  . Highest education level: Not on file  Occupational History  . Not on file  Social Needs  . Financial resource strain: Not on file  . Food insecurity:    Worry: Not on file    Inability: Not on file  . Transportation needs:    Medical: Not on file    Non-medical: Not on file  Tobacco Use  . Smoking status: Current Every Day Smoker    Packs/day: 1.00    Types: Cigarettes  . Smokeless tobacco: Never Used  Substance and Sexual Activity  . Alcohol use: Yes    Comment: occ  . Drug use: Yes    Types: Marijuana  . Sexual activity: Not on file  Lifestyle  . Physical activity:    Days per week: Not on file    Minutes per session: Not on file  . Stress: Not on file  Relationships  . Social connections:    Talks on phone: Not on file    Gets together: Not on file    Attends religious service: Not on file    Active member of club or organization: Not on file    Attends meetings of clubs or organizations: Not on file    Relationship status: Not on file  Other Topics Concern  . Not on file  Social History Narrative  . Not on file   Additional Social History:  Sleep: Fair  Appetite:  Fair  Current Medications: Current Facility-Administered Medications  Medication Dose Route Frequency Provider Last Rate Last Dose  . acetaminophen (TYLENOL) tablet 650 mg  650 mg Oral Q6H PRN Ehren Berisha B, MD      . alum & mag hydroxide-simeth (MAALOX/MYLANTA) 200-200-20 MG/5ML suspension 30 mL  30 mL Oral Q4H PRN [place] Silveria B, MD      . divalproex (DEPAKOTE) DR tablet 500 mg  500 mg Oral Q8H Wane Mollett B, MD   500 mg at 12/31/17 0611  . docusate sodium (COLACE) capsule 100 mg  100 mg Oral Q12H Olliver Boyadjian B, MD   100 mg at 12/30/17 2150  . hydrOXYzine (ATARAX/VISTARIL) tablet 50 mg  50 mg Oral TID PRN Vitoria Conyer B, MD      . magnesium hydroxide (MILK OF MAGNESIA)  suspension 30 mL  30 mL Oral Daily PRN Mileidy Atkin B, MD      . nicotine (NICODERM CQ - dosed in mg/24 hours) patch 21 mg  21 mg Transdermal Q0600 Lawayne Hartig B, MD   21 mg at 12/31/17 0624  . paliperidone (INVEGA SUSTENNA) injection 234 mg  234 mg Intramuscular Q28 days Jazelle Achey B, MD      . Melene Muller[START ON 01/01/2018] paliperidone (INVEGA) 24 hr tablet 9 mg  9 mg Oral Daily Roth Ress B, MD      . traZODone (DESYREL) tablet 100 mg  100 mg Oral QHS Marg Macmaster B, MD   100 mg at 12/30/17 2151    Lab Results: No results found for this or any previous visit (from the past 48 hour(s)).  Blood Alcohol level:  Lab Results  Component Value Date   ETH 52 (H) 12/29/2017   ETH 312 (H) 06/21/2013    Metabolic Disorder Labs: Lab Results  Component Value Date   HGBA1C 5.3 12/29/2017   MPG 105.41 12/29/2017   No results found for: PROLACTIN Lab Results  Component Value Date   CHOL 203 (H) 12/29/2017   TRIG 69 12/29/2017   HDL 59 12/29/2017   CHOLHDL 3.4 12/29/2017   VLDL 14 12/29/2017   LDLCALC 130 (H) 12/29/2017    Physical Findings: AIMS: Facial and Oral Movements Muscles of Facial Expression: None, normal Lips and Perioral Area: None, normal Jaw: None, normal Tongue: None, normal,Extremity Movements Upper (arms, wrists, hands, fingers): None, normal Lower (legs, knees, ankles, toes): None, normal, Trunk Movements Neck, shoulders, hips: None, normal, Overall Severity Severity of abnormal movements (highest score from questions above): None, normal Incapacitation due to abnormal movements: None, normal Patient's awareness of abnormal movements (rate only patient's report): No Awareness, Dental Status Current problems with teeth and/or dentures?: No Does patient usually wear dentures?: No  CIWA:  CIWA-Ar Total: 2 COWS:  COWS Total Score: 2  Musculoskeletal: Strength & Muscle Tone: within normal limits Gait & Station: normal Patient leans:  N/A  Psychiatric Specialty Exam: Physical Exam  Nursing note and vitals reviewed. Psychiatric: His speech is normal. He is slowed and withdrawn. Thought content is paranoid and delusional. Cognition and memory are impaired. He expresses impulsivity.    Review of Systems  Neurological: Negative.   Psychiatric/Behavioral: Positive for hallucinations and substance abuse.  All other systems reviewed and are negative.   Blood pressure 123/71, pulse (!) 107, temperature (!) 97.5 F (36.4 C), temperature source Oral, resp. rate 16, height 5\' 7"  (1.702 m), weight 56.2 kg (124 lb), SpO2 100 %.Body mass index is 19.42 kg/m.  General Appearance: Fairly  Groomed  Eye Contact:  Good  Speech:  Slurred  Volume:  Normal  Mood:  Anxious  Affect:  Flat  Thought Process:  Goal Directed and Descriptions of Associations: Intact  Orientation:  Full (Time, Place, and Person)  Thought Content:  Delusions, Hallucinations: Auditory and Paranoid Ideation  Suicidal Thoughts:  No  Homicidal Thoughts:  No  Memory:  Immediate;   Fair Recent;   Fair Remote;   Fair  Judgement:  Poor  Insight:  Lacking  Psychomotor Activity:  Normal  Concentration:  Concentration: Fair and Attention Span: Fair  Recall:  [employer] of Knowledge:  Fair  Language:  Fair  Akathisia:  No  Handed:  Right  AIMS (if indicated):     Assets:  Communication Skills Desire for Improvement Financial Resources/Insurance Physical Health Resilience Social Support  ADL's:  Intact  Cognition:  WNL  Sleep:  Number of Hours: 7.3     Treatment Plan Summary: Daily contact with patient to assess and evaluate symptoms and progress in treatment and Medication management   Mr. Smalls is a 37 year old male with a history of psychosis, mood instability and substance abuse admitted for psychotic bereak.  #Mood/psychosis -increase Invega to 9 mg daily  -[place] sustenna 234 mg injection today -continue Depakote 500 mg TID -Trazodone 100 mg  nightly  #Substance abuse -positive for cannabis and alcohol -VS are stable, denies symptoms of withdrawal, will monitor -minimizes problems and declines treatment  #Labs -lipid panel, TSH and A1C are unremarkable -EKG  #Disposition -discharge with family -follow up with a local provider     Kristine Linea, MD 12/31/2017, 9:33 AM

## 2017-12-31 NOTE — Progress Notes (Signed)
D- Patient alert and oriented. Patient presents in a pleasant mood on assessment stating that he slept "good" last night and has not voiced any major complaints at this time. Patient denies SI, HI, AVH, and pain at this time. Patient's goal for today is to "try to get out of the hospital, and the court system".  A- Scheduled medications administered to patient, per MD orders. Support and encouragement provided.  Routine safety checks conducted every 15 minutes.  Patient informed to notify staff with problems or concerns.  R- No adverse drug reactions noted. Patient contracts for safety at this time. Patient compliant with medications and treatment plan. Patient receptive, calm, and cooperative. Patient interacts well with others on the unit.  Patient remains safe at this time.

## 2017-12-31 NOTE — Plan of Care (Signed)
Patient slept for Estimated Hours of 7.30; Precautionary checks every 15 minutes for safety maintained, room free of safety hazards, patient sustains no injury or falls during this shift.  Problem: Education: Goal: Will be free of psychotic symptoms Outcome: Progressing Goal: Knowledge of the prescribed therapeutic regimen will improve Outcome: Progressing   Problem: Safety: Goal: Ability to remain free from injury will improve Outcome: Progressing   Problem: Clinical Measurements: Goal: Will remain free from infection Outcome: Progressing   Problem: Coping: Goal: Coping ability will improve Outcome: Not Progressing

## 2017-12-31 NOTE — [facility] Group Notes (Signed)
LCSW Group Therapy Note  12/31/2017 1:00 pm  Type of Therapy/Topic:  Group Therapy:  Emotion Regulation  Participation Level:  Minimal   Description of Group:    The purpose of this group is to assist patients in learning to regulate negative emotions and experience positive emotions. Patients will be guided to discuss ways in which they have been vulnerable to their negative emotions. These vulnerabilities will be juxtaposed with experiences of positive emotions or situations, and patients will be challenged to use positive emotions to combat negative ones. Special emphasis will be placed on coping with negative emotions in conflict situations, and patients will process healthy conflict resolution skills.  Therapeutic Goals: 1. Patient will identify two positive emotions or experiences to reflect on in order to balance out negative emotions 2. Patient will label two or more emotions that they find the most difficult to experience 3. Patient will demonstrate positive conflict resolution skills through discussion and/or role plays  Summary of Patient Progress:  Tresa EndoKelly did not chose to actively participate in today's group discussion on emotion regulation. He did share with group, when prompted by CSW, that the most difficult emotion that he experiences is feelings of rejection.       Therapeutic Modalities:   Cognitive Behavioral Therapy Feelings Identification Dialectical Behavioral Therapy

## 2018-01-01 MED ORDER — PALIPERIDONE PALMITATE ER 156 MG/ML IM SUSY: 156.0000 mg | PREFILLED_SYRINGE | Freq: Once | INTRAMUSCULAR | Status: DC

## 2018-01-01 NOTE — Progress Notes (Signed)
Recreation Therapy Notes  Date: 01/01/2018  Time: 9:30 am  Location: Room 21  Behavioral response: Appropriate   Intervention Topic: Animal Assisted Therapy  Discussion/Intervention:  Patient participated in Animal Assisted Therapy during group today. Group facilitator defined Animal Assisted Therapy as the use of animals as a therapeutic tool to assist a person in restoring balance to their life.  The group facilitator also described the benefits of Animal Assisted Therapy as improving patients' mental, physical, social and emotional functioning with the aid of animals; depending on the needs of the patient. Individuals in the group were able to pet the dogs as well as ask questions.  Clinical Observations/Feedback:  Patient came to group and was focused on what peers and staff had to say about the topic at hand. Individual was social with peers and staff while stay on topic during group. Participant was engaged with the dogs during group. Larry Vaughn LRT/CTRS         Karim Aiello 01/01/2018 10:54 AM

## 2018-01-01 NOTE — Progress Notes (Signed)
[facility] MD Progress Note  01/01/2018 3:18 PM Larry Vaughn  MRN:  [id number]  Subjective:   Larry Vaughn denies any symptoms of depression, anxiety or psychosis. He is out of his room participating in programming. Played basketball outside. Sleeps well, tolerates medications well. Will check VPA in am. Will receive second injection of Invega sustenna on Sunday.   Principal Problem: Undifferentiated schizophrenia (HCC) Diagnosis:   Patient Active Problem List   Diagnosis Date Noted  . Undifferentiated schizophrenia (HCC) [F20.3] 12/29/2017    Priority: High  . Cannabis use disorder, moderate, dependence (HCC) [F12.20] 12/29/2017  . Alcohol use disorder, moderate, dependence (HCC) [F10.20] 12/29/2017  . Tobacco use disorder [F17.200] 12/29/2017  . Intellectual disability [F79] 12/29/2017  . History of psychiatric disorder [Z86.59] 06/22/2013  . Respiratory failure (HCC) [J96.90] 06/21/2013  . Altered mental status [R41.82] 06/21/2013  . Alcohol intoxication with blood level over 0.3 [R78.0] 06/21/2013  . Acute respiratory failure (HCC) [J96.00] 06/21/2013   Total Time spent with patient: 20 minutes  Past Psychiatric History: schizophrenia  Past Medical History:  Past Medical History:  Diagnosis Date  . Schizophrenia (HCC)   . Seizures (HCC)    History reviewed. No pertinent surgical history. Family History: History reviewed. No pertinent family history. Family Psychiatric  History: none Social History:  Social History   Substance and Sexual Activity  Alcohol Use Yes   Comment: occ     Social History   Substance and Sexual Activity  Drug Use Yes  . Types: Marijuana    Social History   Socioeconomic History  . Marital status: Single    Spouse name: Not on file  . Number of children: Not on file  . Years of education: Not on file  . Highest education level: Not on file  Occupational History  . Not on file  Social Needs  . Financial resource strain: Not on file  . Food  insecurity:    Worry: Not on file    Inability: Not on file  . Transportation needs:    Medical: Not on file    Non-medical: Not on file  Tobacco Use  . Smoking status: Current Every Day Smoker    Packs/day: 1.00    Types: Cigarettes  . Smokeless tobacco: Never Used  Substance and Sexual Activity  . Alcohol use: Yes    Comment: occ  . Drug use: Yes    Types: Marijuana  . Sexual activity: Not on file  Lifestyle  . Physical activity:    Days per week: Not on file    Minutes per session: Not on file  . Stress: Not on file  Relationships  . Social connections:    Talks on phone: Not on file    Gets together: Not on file    Attends religious service: Not on file    Active member of club or organization: Not on file    Attends meetings of clubs or organizations: Not on file    Relationship status: Not on file  Other Topics Concern  . Not on file  Social History Narrative  . Not on file   Additional Social History:                         Sleep: Fair  Appetite:  Fair  Current Medications: Current Facility-Administered Medications  Medication Dose Route Frequency Provider Last Rate Last Dose  . acetaminophen (TYLENOL) tablet 650 mg  650 mg Oral Q6H PRN Aidynn Krenn B,  MD      . alum & mag hydroxide-simeth (MAALOX/MYLANTA) 200-200-20 MG/5ML suspension 30 mL  30 mL Oral Q4H PRN Omolara Carol B, MD      . divalproex (DEPAKOTE) DR tablet 500 mg  500 mg Oral Q8H Tre Sanker B, MD   500 mg at 01/01/18 1355  . docusate sodium (COLACE) capsule 100 mg  100 mg Oral Q12H Desman Polak B, MD   100 mg at 12/31/17 2135  . hydrOXYzine (ATARAX/VISTARIL) tablet 50 mg  50 mg Oral TID PRN Daya Dutt B, MD      . magnesium hydroxide (MILK OF MAGNESIA) suspension 30 mL  30 mL Oral Daily PRN Gedalya Jim B, MD      . nicotine (NICODERM CQ - dosed in mg/24 hours) patch 21 mg  21 mg Transdermal Q0600 Jestin Burbach B, MD   21 mg at 01/01/18  0619  . [START ON 01/04/2018] paliperidone (INVEGA SUSTENNA) injection 156 mg  156 mg Intramuscular Once Prescott Truex B, MD      . paliperidone (INVEGA SUSTENNA) injection 234 mg  234 mg Intramuscular Q28 days Meilah Delrosario B, MD   234 mg at 12/31/17 1255  . paliperidone (INVEGA) 24 hr tablet 9 mg  9 mg Oral Daily Onofrio Klemp B, MD   9 mg at 01/01/18 0853  . traZODone (DESYREL) tablet 100 mg  100 mg Oral QHS Alayah Knouff B, MD   100 mg at 12/31/17 2135    Lab Results: No results found for this or any previous visit (from the past 48 hour(s)).  Blood Alcohol level:  Lab Results  Component Value Date   ETH 52 (H) 12/29/2017   ETH 312 (H) 06/21/2013    Metabolic Disorder Labs: Lab Results  Component Value Date   HGBA1C 5.3 12/29/2017   MPG 105.41 12/29/2017   No results found for: PROLACTIN Lab Results  Component Value Date   CHOL 203 (H) 12/29/2017   TRIG 69 12/29/2017   HDL 59 12/29/2017   CHOLHDL 3.4 12/29/2017   VLDL 14 12/29/2017   LDLCALC 130 (H) 12/29/2017    Physical Findings: AIMS: Facial and Oral Movements Muscles of Facial Expression: None, normal Lips and Perioral Area: None, normal Jaw: None, normal Tongue: None, normal,Extremity Movements Upper (arms, wrists, hands, fingers): None, normal Lower (legs, knees, ankles, toes): None, normal, Trunk Movements Neck, shoulders, hips: None, normal, Overall Severity Severity of abnormal movements (highest score from questions above): None, normal Incapacitation due to abnormal movements: None, normal Patient's awareness of abnormal movements (rate only patient's report): No Awareness, Dental Status Current problems with teeth and/or dentures?: No Does patient usually wear dentures?: No  CIWA:  CIWA-Ar Total: 2 COWS:  COWS Total Score: 2  Musculoskeletal: Strength & Muscle Tone: within normal limits Gait & Station: normal Patient leans: N/A  Psychiatric Specialty Exam: Physical Exam   Nursing note and vitals reviewed. Psychiatric: His behavior is normal. Thought content normal. His affect is blunt. His speech is slurred. Cognition and memory are normal. He expresses impulsivity.    Review of Systems  Neurological: Negative.   Psychiatric/Behavioral: Positive for hallucinations and substance abuse.  All other systems reviewed and are negative.   Blood pressure 125/70, pulse 82, temperature 97.6 F (36.4 C), temperature source Oral, resp. rate 18, height 5\' 7"  (1.702 m), weight 56.2 kg (124 lb), SpO2 100 %.Body mass index is 19.42 kg/m.  General Appearance: Casual  Eye Contact:  Poor  Speech:  Slurred  Volume:  Normal  Mood:  Euthymic  Affect:  Flat  Thought Process:  Goal Directed and Descriptions of Associations: Intact  Orientation:  Full (Time, Place, and Person)  Thought Content:  WDL  Suicidal Thoughts:  No  Homicidal Thoughts:  No  Memory:  Immediate;   Fair Recent;   Fair Remote;   Fair  Judgement:  Impaired  Insight:  Lacking  Psychomotor Activity:  Normal  Concentration:  Concentration: Fair and Attention Span: Fair  Recall:  [employer]Fair  Fund of Knowledge:  Fair  Language:  Fair  Akathisia:  No  Handed:  Right  AIMS (if indicated):     Assets:  Communication Skills Desire for Improvement Housing Physical Health Resilience Social Support  ADL's:  Intact  Cognition:  WNL  Sleep:  Number of Hours: 6.15     Treatment Plan Summary: Daily contact with patient to assess and evaluate symptoms and progress in treatment and Medication management   Mr. [name] is a 37 year old male with a history of psychosis, mood instability and substance abuse admitted for psychotic bereak.  #Mood/psychosis -continue oral Invega 9 mg daily  -Invega sustenna 234 mg injection given on 6/12, next injection 156 mg on Sunday -continue Depakote 500 mg TID, level in am -Trazodone 100 mg nightly  #Substance abuse -positive for cannabisand alcohol -minimizes  problems and declines treatment  #Labs -lipid panel, TSH and A1C are unremarkable -EKG reviewed, NSR with QTc 415  #Disposition -discharge with family -follow up with a local provider    Kristine LineaJolanta Amedeo Detweiler, MD 01/01/2018, 3:18 PM

## 2018-01-01 NOTE — Plan of Care (Signed)
Improved in mood and affect, not isolated to room this evening, observed in the day room playing cards with peers, laughing and giggling, denied SI/HI/AVH, medication compliant, still disheveled with strong body odor.   Patient slept for Estimated Hours of 6.15; Precautionary checks every 15 minutes for safety maintained, room free of safety hazards, patient sustains no injury or falls during this shift.  Problem: Education: Goal: Will be free of psychotic symptoms Outcome: Progressing   Problem: Coping: Goal: Coping ability will improve Outcome: Progressing Goal: Will verbalize feelings Outcome: Progressing   Problem: Safety: Goal: Ability to remain free from injury will improve Outcome: Progressing

## 2018-01-01 NOTE — [facility] Group Notes (Signed)
  01/01/2018  Time: 1PM  Type of Therapy/Topic:  Group Therapy:  Balance in Life  Participation Level:  Minimal  Description of Group:   This group will address the concept of balance and how it feels and looks when one is unbalanced. Patients will be encouraged to process areas in their lives that are out of balance and identify reasons for remaining unbalanced. Facilitators will guide patients in utilizing problem-solving interventions to address and correct the stressor making their life unbalanced. Understanding and applying boundaries will be explored and addressed for obtaining and maintaining a balanced life. Patients will be encouraged to explore ways to assertively make their unbalanced needs known to significant others in their lives, using other group members and facilitator for support and feedback.  Therapeutic Goals: 1. Patient will identify two or more emotions or situations they have that consume much of in their lives. 2. Patient will identify signs/triggers that life has become out of balance:  3. Patient will identify two ways to set boundaries in order to achieve balance in their lives:  4. Patient will demonstrate ability to communicate their needs through discussion and/or role plays  Summary of Patient Progress: Pt continues to work towards their tx goals but has not yet reached them. Pt was able to appropriately participate in group discussion, and was able to offer support/validation to other group members. Pt reported he would like to spend more time on his family and would like to spend less time "in hospitals."     Therapeutic Modalities:   Cognitive Behavioral Therapy Solution-Focused Therapy Assertiveness Training  Heidi DachKelsey Rushil Kimbrell, MSW, LCSW Clinical Social Worker 01/01/2018 2:04 PM

## 2018-01-01 NOTE — Plan of Care (Signed)
Data: Patient is appropriate and cooperative to assessment. Patient denies SI/HI and denies AVH. Patient has completed daily self inventory worksheet. Patient reports fair mood and is has an appropriate affect. Patient is minimal in interaction and brief in response. Patient has no complaints and a pain rating of 0/10. Patient reports good sleep quality, appetite is good. Patient rates depression "0/10" , feelings of hopelessness "0/10" and anxiety "0/10" Patients goal for today is "nothing." Patient is present in the milieu.   Action:  Q x 15 minute observation checks were completed for safety. Patient was provided with education on medications. Patient was offered support and encouragement. Patient was given scheduled medications. Patient  was encourage to attend groups, participate in unit activities and continue with plan of care.     Response: Patient is medication compliant. Patient has no complaints at this time. Patient is receptive to treatment and safety maintained on unit.    Problem: Education: Goal: Knowledge of the prescribed therapeutic regimen will improve Outcome: Progressing   Problem: Coping: Goal: Will verbalize feelings Outcome: Progressing   Problem: Safety: Goal: Ability to remain free from injury will improve Outcome: Progressing   Problem: Safety: Goal: Ability to disclose and discuss suicidal ideas will improve Outcome: Progressing

## 2018-01-01 NOTE — [facility] Group Notes (Signed)
LCSW Group Therapy Note 01/01/2018 9:00AM  Type of Therapy and Topic:  Group Therapy:  Setting Goals  Participation Level:  Minimal  Description of Group: In this process group, patients discussed using strengths to work toward goals and address challenges.  Patients identified two positive things about themselves and one goal they were working on.  Patients were given the opportunity to share openly and support each other's plan for self-empowerment.  The group discussed the value of gratitude and were encouraged to have a daily reflection of positive characteristics or circumstances.  Patients were encouraged to identify a plan to utilize their strengths to work on current challenges and goals.  Therapeutic Goals 1. Patient will verbalize personal strengths/positive qualities and relate how these can assist with achieving desired personal goals 2. Patients will verbalize affirmation of peers plans for personal change and goal setting 3. Patients will explore the value of gratitude and positive focus as related to successful achievement of goals 4. Patients will verbalize a plan for regular reinforcement of personal positive qualities and circumstances.  Summary of Patient Progress:  Tresa EndoKelly participated some in today's group discussion on setting goals using the SMART Model. Tresa EndoKelly shared that he has a long-term goal of getting off all of his psychotropic medications.  He agreed that he needed to start working on this goal by establishing a plan with his psychiatrist while in the hospital as well as his psychiatric prescriber when he goes back into his community.       Therapeutic Modalities Cognitive Behavioral Therapy Motivational Interviewing    Alease FrameSonya S Kerly Rigsbee, KentuckyLCSW 01/01/2018 11:35 AM

## 2018-01-02 LAB — AMMONIA: AMMONIA: 38 umol/L — AB (ref 9–35)

## 2018-01-02 LAB — VALPROIC ACID LEVEL: VALPROIC ACID LVL: 118 ug/mL — AB (ref 50.0–100.0)

## 2018-01-02 MED ORDER — DIVALPROEX SODIUM 500 MG PO DR TAB: 500.0000 mg | DELAYED_RELEASE_TABLET | Freq: Three times a day (TID) | ORAL | Status: DC

## 2018-01-02 MED ORDER — DIVALPROEX SODIUM 500 MG PO DR TAB
500.0000 mg | DELAYED_RELEASE_TABLET | Freq: Two times a day (BID) | ORAL | Status: DC
  Administered 2018-01-03 – 2018-01-05 (×5): 500 mg via ORAL
  Filled 2018-01-02 (×5): qty 1

## 2018-01-02 NOTE — Plan of Care (Signed)
Patient is improving in all areas and cooperating with care of ADLs , socializing well with peers with out issues, patient states I'm doing well, patient contract for safety of self and other, denies any suicide ideations and no signes of AVH Problem: Education: Goal: Will be free of psychotic symptoms Outcome: Progressing Goal: Knowledge of the prescribed therapeutic regimen will improve Outcome: Progressing   Problem: Coping: Goal: Coping ability will improve Outcome: Progressing Goal: Will verbalize feelings Outcome: Progressing   Problem: Coping: Goal: Coping ability will improve Outcome: Progressing Goal: Will verbalize feelings Outcome: Progressing   Problem: Health Behavior/Discharge Planning: Goal: Ability to identify changes in lifestyle to reduce recurrence of condition will improve Outcome: Progressing Goal: Identification of resources available to assist in meeting health care needs will improve Outcome: Progressing   Problem: Safety: Goal: Ability to remain free from injury will improve Outcome: Progressing   Problem: Education: Goal: Knowledge of General Education information will improve Outcome: Progressing   Problem: Health Behavior/Discharge Planning: Goal: Ability to manage health-related needs will improve Outcome: Progressing   Problem: Safety: Goal: Ability to remain free from injury will improve Outcome: Progressing   Problem: Safety: Goal: Ability to disclose and discuss suicidal ideas will improve Outcome: Progressing Goal: Ability to identify and utilize support systems that promote safety will improve Outcome: Progressing

## 2018-01-02 NOTE — [facility] Suicide Risk Assessment (Signed)
[facility] INPATIENT:  Family/Significant Other Suicide Prevention Education  Suicide Prevention Education:  Contact Attempts: Larry Vaughn (mother) at [phone] has been identified by the patient as the family member/significant other with whom the patient will be residing, and identified as the person(s) who will aid the patient in the event of a mental health crisis.  With written consent from the patient, two attempts were made to provide suicide prevention education, prior to and/or following the patient's discharge.  We were unsuccessful in providing suicide prevention education.  A suicide education pamphlet was given to the patient to share with family/significant other.  Date and time of first attempt: 01/01/2018/2:43 PM Date and time of second attempt:01/02/2018/2:54 PM  Larry Vaughn, [place] MtLCSW 01/02/2018, 3:43 PM

## 2018-01-02 NOTE — Progress Notes (Signed)
Patient ID: Larry Vaughn, male   DOB: [birth year], 37 y.o.   MRN: [id number] PER STATE REGULATIONS 482.30  THIS CHART WAS REVIEWED FOR MEDICAL NECESSITY WITH RESPECT TO THE PATIENT'S ADMISSION/ DURATION OF STAY.  NEXT REVIEW DATE: 01/06/2018  Willa RoughJENNIFER JONES Jakelin Taussig, RN, BSN CASE MANAGER

## 2018-01-02 NOTE — [facility] Group Notes (Signed)
[facility] Group Notes:  (Nursing/MHT/Case Management/Adjunct)  Date:  01/02/2018  Time:  9:26 PM  Type of Therapy:  Group Therapy  Participation Level:  Did Not Attend   Larry NeighborsKeith Vaughn Larry Vaughn 01/02/2018, 9:26 PM

## 2018-01-02 NOTE — Progress Notes (Signed)
Patient ID: Larry Vaughn, male   DOB: 10-Nov-1980, 37 y.o.   MRN: [id number] CSW attempted to contact pt's mother, Merrily Brittlennie Koning at [phone], but could not reach her.  CSW was unable to leave a voice mail message.  CSW followed up with pt to inquire if he had spoken to his mother about his discharge on Monday, January 05, 2018.  Pt informed CSW that he had not spoken with his mother and that the best time to contact his mother is around 1:30PM as she is getting up to go to work during this time.

## 2018-01-02 NOTE — Plan of Care (Signed)
Data: Patient is appropriate and cooperative to assessment. Patient denies SI/HI and denies AVH. Patient has completed daily self inventory worksheet. Patient reports fair mood, affect is appropriate. Patient has no complaints and a pain rating of 0/10. Patient reports good sleep quality, appetite is good. Patient rates depression "0/10" , feelings of hopelessness "0/10" and anxiety "0/10" Patients goal for today is "go home."  Action:  Q x 15 minute observation checks were completed for safety. Patient was provided with education on medications. Patient was offered support and encouragement. Patient was given scheduled medications. Patient  was encourage to attend groups, participate in unit activities and continue with plan of care.     Response: Patient is medication compliant, and attends groups. Patient is present in milieu. Patient has no complaints at this time. Patient is receptive to treatment and safety maintained on unit.    Problem: Education: Goal: Knowledge of the prescribed therapeutic regimen will improve Outcome: Progressing   Problem: Coping: Goal: Coping ability will improve Outcome: Progressing Goal: Will verbalize feelings Outcome: Progressing   Problem: Coping: Goal: Coping ability will improve Outcome: Progressing   Problem: Safety: Goal: Ability to remain free from injury will improve Outcome: Progressing   Problem: Safety: Goal: Ability to disclose and discuss suicidal ideas will improve Outcome: Progressing

## 2018-01-02 NOTE — Progress Notes (Addendum)
[facility] MD Progress Note  01/02/2018 1:26 PM Larry Vaughn  MRN:  [id number]  Subjective:    Larry Vaughn has no complaints. He accepts and tolerates medications well. Denies hallucinations or depression. He has been much more engaging and interacts with staff and peers appropriately. Good program participation.   VPA and ammonia level high. Hold Depakote today.   Principal Problem: Undifferentiated schizophrenia (HCC) Diagnosis:   Patient Active Problem List   Diagnosis Date Noted  . Undifferentiated schizophrenia (HCC) [F20.3] 12/29/2017    Priority: High  . Cannabis use disorder, moderate, dependence (HCC) [F12.20] 12/29/2017  . Alcohol use disorder, moderate, dependence (HCC) [F10.20] 12/29/2017  . Tobacco use disorder [F17.200] 12/29/2017  . Intellectual disability [F79] 12/29/2017  . History of psychiatric disorder [Z86.59] 06/22/2013  . Respiratory failure (HCC) [J96.90] 06/21/2013  . Altered mental status [R41.82] 06/21/2013  . Alcohol intoxication with blood level over 0.3 [R78.0] 06/21/2013  . Acute respiratory failure (HCC) [J96.00] 06/21/2013   Total Time spent with patient: 20 minutes  Past Psychiatric History: schizophrenia  Past Medical History:  Past Medical History:  Diagnosis Date  . Schizophrenia (HCC)   . Seizures (HCC)    History reviewed. No pertinent surgical history. Family History: History reviewed. No pertinent family history. Family Psychiatric  History: none Social History:  Social History   Substance and Sexual Activity  Alcohol Use Yes   Comment: occ     Social History   Substance and Sexual Activity  Drug Use Yes  . Types: Marijuana    Social History   Socioeconomic History  . Marital status: Single    Spouse name: Not on file  . Number of children: Not on file  . Years of education: Not on file  . Highest education level: Not on file  Occupational History  . Not on file  Social Needs  . Financial resource strain: Not on file  .  Food insecurity:    Worry: Not on file    Inability: Not on file  . Transportation needs:    Medical: Not on file    Non-medical: Not on file  Tobacco Use  . Smoking status: Current Every Day Smoker    Packs/day: 1.00    Types: Cigarettes  . Smokeless tobacco: Never Used  Substance and Sexual Activity  . Alcohol use: Yes    Comment: occ  . Drug use: Yes    Types: Marijuana  . Sexual activity: Not on file  Lifestyle  . Physical activity:    Days per week: Not on file    Minutes per session: Not on file  . Stress: Not on file  Relationships  . Social connections:    Talks on phone: Not on file    Gets together: Not on file    Attends religious service: Not on file    Active member of club or organization: Not on file    Attends meetings of clubs or organizations: Not on file    Relationship status: Not on file  Other Topics Concern  . Not on file  Social History Narrative  . Not on file   Additional Social History:                         Sleep: Fair  Appetite:  Fair  Current Medications: Current Facility-Administered Medications  Medication Dose Route Frequency Provider Last Rate Last Dose  . acetaminophen (TYLENOL) tablet 650 mg  650 mg Oral Q6H PRN Daneli Butkiewicz  B, MD      . alum & mag hydroxide-simeth (MAALOX/MYLANTA) 200-200-20 MG/5ML suspension 30 mL  30 mL Oral Q4H PRN Burlin Mcnair B, MD      . divalproex (DEPAKOTE) DR tablet 500 mg  500 mg Oral Q8H Alilah Mcmeans B, MD   500 mg at 01/02/18 0640  . docusate sodium (COLACE) capsule 100 mg  100 mg Oral Q12H Lonnetta Kniskern B, MD   100 mg at 01/01/18 2135  . hydrOXYzine (ATARAX/VISTARIL) tablet 50 mg  50 mg Oral TID PRN Naven Giambalvo B, MD      . magnesium hydroxide (MILK OF MAGNESIA) suspension 30 mL  30 mL Oral Daily PRN Irelyn Perfecto B, MD      . nicotine (NICODERM CQ - dosed in mg/24 hours) patch 21 mg  21 mg Transdermal Q0600 Lianette Broussard B, MD   21 mg at  01/02/18 0643  . [START ON 01/04/2018] paliperidone (INVEGA SUSTENNA) injection 156 mg  156 mg Intramuscular Once Javarion Douty B, MD      . paliperidone (INVEGA SUSTENNA) injection 234 mg  234 mg Intramuscular Q28 days Boston Cookson B, MD   234 mg at 12/31/17 1255  . paliperidone (INVEGA) 24 hr tablet 9 mg  9 mg Oral Daily Kenlea Woodell B, MD   9 mg at 01/02/18 0818  . traZODone (DESYREL) tablet 100 mg  100 mg Oral QHS Erby Sanderson B, MD   100 mg at 01/01/18 2135    Lab Results:  Results for orders placed or performed during the hospital encounter of 12/29/17 (from the past 48 hour(s))  Valproic acid level     Status: Abnormal   Collection Time: 01/02/18  6:53 AM  Result Value Ref Range   Valproic Acid Lvl 118 (H) 50.0 - 100.0 ug/mL    Comment: Performed at [facility], [address] [place], [place] [address]  Ammonia     Status: Abnormal   Collection Time: 01/02/18  6:53 AM  Result Value Ref Range   Ammonia 38 (H) 9 - 35 umol/L    Comment: Performed at [facility], [address] [place], [place] [address]    Blood Alcohol level:  Lab Results  Component Value Date   ETH 52 (H) 12/29/2017   ETH 312 (H) 06/21/2013    Metabolic Disorder Labs: Lab Results  Component Value Date   HGBA1C 5.3 12/29/2017   MPG 105.41 12/29/2017   No results found for: PROLACTIN Lab Results  Component Value Date   CHOL 203 (H) 12/29/2017   TRIG 69 12/29/2017   HDL 59 12/29/2017   CHOLHDL 3.4 12/29/2017   VLDL 14 12/29/2017   LDLCALC 130 (H) 12/29/2017    Physical Findings: AIMS: Facial and Oral Movements Muscles of Facial Expression: None, normal Lips and Perioral Area: None, normal Jaw: None, normal Tongue: None, normal,Extremity Movements Upper (arms, wrists, hands, fingers): None, normal Lower (legs, knees, ankles, toes): None, normal, Trunk Movements Neck, shoulders, hips: None, normal, Overall Severity Severity of abnormal movements  (highest score from questions above): None, normal Incapacitation due to abnormal movements: None, normal Patient's awareness of abnormal movements (rate only patient's report): No Awareness, Dental Status Current problems with teeth and/or dentures?: No Does patient usually wear dentures?: No  CIWA:  CIWA-Ar Total: 2 COWS:  COWS Total Score: 2  Musculoskeletal: Strength & Muscle Tone: within normal limits Gait & Station: normal Patient leans: N/A  Psychiatric Specialty Exam: Physical Exam  ROS  Blood pressure Marland Kitchen)  93/53, pulse (!) 137, temperature 98 F (36.7 C), temperature source Oral, resp. rate 18, height 5\' 7"  (1.702 m), weight 56.2 kg (124 lb), SpO2 100 %.Body mass index is 19.42 kg/m.  General Appearance: Casual  Eye Contact:  Good  Speech:  Clear and Coherent  Volume:  Normal  Mood:  Euthymic  Affect:  Blunt  Thought Process:  Goal Directed and Descriptions of Associations: Intact  Orientation:  Full (Time, Place, and Person)  Thought Content:  WDL  Suicidal Thoughts:  No  Homicidal Thoughts:  No  Memory:  Immediate;   Fair Recent;   Fair Remote;   Fair  Judgement:  Poor  Insight:  Lacking  Psychomotor Activity:  Normal  Concentration:  Concentration: Fair and Attention Span: Fair  Recall:  [employer]  Fund of Knowledge:  Fair  Language:  Fair  Akathisia:  No  Handed:  Right  AIMS (if indicated):     Assets:  Communication Skills Desire for Improvement Financial Resources/Insurance Housing Physical Health Resilience Social Support  ADL's:  Intact  Cognition:  WNL  Sleep:  Number of Hours: 8.15     Treatment Plan Summary: Daily contact with patient to assess and evaluate symptoms and progress in treatment and Medication management   Larry Vaughn is a 37 year old male with a history of psychosis, mood instability and substance abuse admitted for psychotic break. We are holding Depakote today (VPA 118 and ammonia 38) and discontinue oral Invega due to  tachycardia.  #Mood/psychosis -discontinue oral Invega -Invega sustenna 156 mg on Sunday, then monthly -hold Depakote 500 mg TID, restart tomorrow 500 mg BID, level in am  -Trazodone 100 mg nightly  #Substance abuse -positive for cannabisand alcohol -minimizes problems and declines treatment  #Labs -lipid panel, TSH and A1Care unremarkable -EKG reviewed, NSR with QTc 415  #Disposition -discharge with family -follow up with a local provider         Kristine LineaJolanta Tannah Dreyfuss, MD 01/02/2018, 1:26 PM

## 2018-01-02 NOTE — BHH Group Notes (Signed)
LCSW Group Therapy Note  01/02/2018 1:00pm  Type of Therapy and Topic:  Group Therapy:  Feelings around Relapse and Recovery  Participation Level:  Active   Description of Group:    Patients in this group will discuss emotions they experience before and after a relapse. They will process how experiencing these feelings, or avoidance of experiencing them, relates to having a relapse. Facilitator will guide patients to explore emotions they have related to recovery. Patients will be encouraged to process which emotions are more powerful. They will be guided to discuss the emotional reaction significant others in their lives may have to their relapse or recovery. Patients will be assisted in exploring ways to respond to the emotions of others without this contributing to a relapse.  Therapeutic Goals: 1. Patient will identify two or more emotions that lead to a relapse for them 2. Patient will identify two emotions that result when they relapse 3. Patient will identify two emotions related to recovery 4. Patient will demonstrate ability to communicate their needs through discussion and/or role plays   Summary of Patient Progress:  Larry Vaughn actively participated in today's group discussion on feelings around relapse and recovery.  Larry Vaughn was able to identify emotions that he has experienced which has led to a relapse (careless, stressed);  Emotions experienced when he is in a relapse (shame, embarrassed), and emotions that he experiences when he is in recovery (shy, "scared that it will not last").   Therapeutic Modalities:   Cognitive Behavioral Therapy Solution-Focused Therapy Assertiveness Training Relapse Prevention Therapy   Larry FrameSonya S Kaylany Tesoriero, LCSW 01/02/2018 2:33 PM

## 2018-01-03 LAB — VALPROIC ACID LEVEL: VALPROIC ACID LVL: 85 ug/mL (ref 50.0–100.0)

## 2018-01-03 MED ORDER — BENZTROPINE MESYLATE 1 MG PO TABS
1.0000 mg | ORAL_TABLET | Freq: Two times a day (BID) | ORAL | Status: DC
  Administered 2018-01-03 – 2018-01-05 (×4): 1 mg via ORAL
  Filled 2018-01-03 (×4): qty 1

## 2018-01-03 NOTE — Progress Notes (Signed)
[facility] MD Progress Note  01/03/2018 4:17 PM Larry Vaughn  MRN:  [id number]  Subjective:    I'm shaking, I dont wanna take more shot" Pt anxious, preoccupied about having shaking after shot on wed, denies AVH, Denies SI/HI Principal Problem: Undifferentiated schizophrenia (HCC) Diagnosis:   Patient Active Problem List   Diagnosis Date Noted  . Undifferentiated schizophrenia (HCC) [F20.3] 12/29/2017  . Cannabis use disorder, moderate, dependence (HCC) [F12.20] 12/29/2017  . Alcohol use disorder, moderate, dependence (HCC) [F10.20] 12/29/2017  . Tobacco use disorder [F17.200] 12/29/2017  . Intellectual disability [F79] 12/29/2017  . History of psychiatric disorder [Z86.59] 06/22/2013  . Respiratory failure (HCC) [J96.90] 06/21/2013  . Altered mental status [R41.82] 06/21/2013  . Alcohol intoxication with blood level over 0.3 [R78.0] 06/21/2013  . Acute respiratory failure (HCC) [J96.00] 06/21/2013   Total Time spent with patient: 25 min  Past Psychiatric History: schizophrenia  Past Medical History:  Past Medical History:  Diagnosis Date  . Schizophrenia (HCC)   . Seizures (HCC)    History reviewed. No pertinent surgical history. Family History: History reviewed. No pertinent family history. Family Psychiatric  History: none Social History:  Social History   Substance and Sexual Activity  Alcohol Use Yes   Comment: occ     Social History   Substance and Sexual Activity  Drug Use Yes  . Types: Marijuana    Social History   Socioeconomic History  . Marital status: Single    Spouse name: Not on file  . Number of children: Not on file  . Years of education: Not on file  . Highest education level: Not on file  Occupational History  . Not on file  Social Needs  . Financial resource strain: Not on file  . Food insecurity:    Worry: Not on file    Inability: Not on file  . Transportation needs:    Medical: Not on file    Non-medical: Not on file  Tobacco Use  .  Smoking status: Current Every Day Smoker    Packs/day: 1.00    Types: Cigarettes  . Smokeless tobacco: Never Used  Substance and Sexual Activity  . Alcohol use: Yes    Comment: occ  . Drug use: Yes    Types: Marijuana  . Sexual activity: Not on file  Lifestyle  . Physical activity:    Days per week: Not on file    Minutes per session: Not on file  . Stress: Not on file  Relationships  . Social connections:    Talks on phone: Not on file    Gets together: Not on file    Attends religious service: Not on file    Active member of club or organization: Not on file    Attends meetings of clubs or organizations: Not on file    Relationship status: Not on file  Other Topics Concern  . Not on file  Social History Narrative  . Not on file   Additional Social History:                         Sleep: Fair  Appetite:  Fair  Current Medications: Current Facility-Administered Medications  Medication Dose Route Frequency Provider Last Rate Last Dose  . acetaminophen (TYLENOL) tablet 650 mg  650 mg Oral Q6H PRN Pucilowska, Jolanta B, MD   650 mg at 01/03/18 0859  . alum & mag hydroxide-simeth (MAALOX/MYLANTA) 200-200-20 MG/5ML suspension 30 mL  30 mL Oral Q4H  PRN Pucilowska, Jolanta B, MD      . divalproex (DEPAKOTE) DR tablet 500 mg  500 mg Oral Q12H Pucilowska, Jolanta B, MD   500 mg at 01/03/18 0842  . docusate sodium (COLACE) capsule 100 mg  100 mg Oral Q12H Pucilowska, Jolanta B, MD   100 mg at 01/03/18 0842  . hydrOXYzine (ATARAX/VISTARIL) tablet 50 mg  50 mg Oral TID PRN Pucilowska, Jolanta B, MD      . magnesium hydroxide (MILK OF MAGNESIA) suspension 30 mL  30 mL Oral Daily PRN Pucilowska, Jolanta B, MD      . nicotine (NICODERM CQ - dosed in mg/24 hours) patch 21 mg  21 mg Transdermal Q0600 Pucilowska, Jolanta B, MD   21 mg at 01/02/18 0643  . [START ON 01/04/2018] paliperidone (INVEGA SUSTENNA) injection 156 mg  156 mg Intramuscular Once Pucilowska, Jolanta B, MD       . paliperidone (INVEGA SUSTENNA) injection 234 mg  234 mg Intramuscular Q28 days Pucilowska, Jolanta B, MD   234 mg at 12/31/17 1255  . traZODone (DESYREL) tablet 100 mg  100 mg Oral QHS Pucilowska, Jolanta B, MD   100 mg at 01/02/18 2209    Lab Results:  Results for orders placed or performed during the hospital encounter of 12/29/17 (from the past 48 hour(s))  Valproic acid level     Status: Abnormal   Collection Time: 01/02/18  6:53 AM  Result Value Ref Range   Valproic Acid Lvl 118 (H) 50.0 - 100.0 ug/mL    Comment: Performed at [facility], [address] [place], [place] [address]  Ammonia     Status: Abnormal   Collection Time: 01/02/18  6:53 AM  Result Value Ref Range   Ammonia 38 (H) 9 - 35 umol/L    Comment: Performed at [facility], [address] [place], [place] [address]  Valproic acid level     Status: None   Collection Time: 01/03/18  6:53 AM  Result Value Ref Range   Valproic Acid Lvl 85 50.0 - 100.0 ug/mL    Comment: Performed at [facility], [address] [place], [place] [address]    Blood Alcohol level:  Lab Results  Component Value Date   ETH 52 (H) 12/29/2017   ETH 312 (H) 06/21/2013    Metabolic Disorder Labs: Lab Results  Component Value Date   HGBA1C 5.3 12/29/2017   MPG 105.41 12/29/2017   No results found for: PROLACTIN Lab Results  Component Value Date   CHOL 203 (H) 12/29/2017   TRIG 69 12/29/2017   HDL 59 12/29/2017   CHOLHDL 3.4 12/29/2017   VLDL 14 12/29/2017   LDLCALC 130 (H) 12/29/2017    Physical Findings: AIMS: Facial and Oral Movements Muscles of Facial Expression: None, normal Lips and Perioral Area: None, normal Jaw: None, normal Tongue: None, normal,Extremity Movements Upper (arms, wrists, hands, fingers): None, normal Lower (legs, knees, ankles, toes): None, normal, Trunk Movements Neck, shoulders, hips: None, normal, Overall Severity Severity of abnormal movements (highest score  from questions above): None, normal Incapacitation due to abnormal movements: None, normal Patient's awareness of abnormal movements (rate only patient's report): No Awareness, Dental Status Current problems with teeth and/or dentures?: No Does patient usually wear dentures?: No  CIWA:  CIWA-Ar Total: 2 COWS:  COWS Total Score: 2  Musculoskeletal: Strength & Muscle Tone: within normal limits Gait & Station: normal Patient leans: N/A  Psychiatric Specialty Exam: Physical Exam  Nursing note and vitals  reviewed.   ROS  Blood pressure 114/73, pulse 87, temperature 97.7 F (36.5 C), temperature source Oral, resp. rate 18, height 5\' 7"  (1.702 m), weight 56.2 kg (124 lb), SpO2 100 %.Body mass index is 19.42 kg/m.  General Appearance: Casual  Eye Contact:  Good  Speech:  Clear and Coherent  Volume:  Normal  Mood: anxious  Affect:  restricted  Thought Process:  Goal Directed and Descriptions of Associations: Intact  Orientation:  Full (Time, Place, and Person)  Thought Content:  meds  Suicidal Thoughts:  No  Homicidal Thoughts:  No  Memory:  Immediate;   Fair Recent;   Fair Remote;   Fair  Judgement:  Poor  Insight:  Lacking  Psychomotor Activity:  Normal  Concentration:  Concentration: Fair and Attention Span: Fair  Recall:  [employer]  Fund of Knowledge:  Fair  Language:  Fair  Akathisia:  No  Handed:  Right  AIMS (if indicated):     Assets:  Communication Skills Desire for Improvement Financial Resources/Insurance Housing Physical Health Resilience Social Support  ADL's:  Intact  Cognition:  WNL  Sleep:  Number of Hours: 7.45     Treatment Plan Summary: Daily contact with patient to assess and evaluate symptoms and progress in treatment and Medication management   Mr. [name] is a 37 year old male with a history of psychosis, mood instability and substance abuse admitted for psychotic break. Depakote was held yesterday  (VPA 118 and ammonia 38) and oral Invega  discontinued yesterday due to tachycardia. VPA today- 85.    -2nd Invega sustenna 156 mg planned on Sunday,pt not willing to take it, hold it for now. Add cogentin 1mg  bid for possible EPS.    - Cont Depakote 500 mg BID,  -Trazodone 100 mg nightly          Beverly SessionsJagannath Addilynn Mowrer, MD 01/03/2018, 4:17 PMPatient ID: Larry Vaughn, male   DOB: 06-10-1981, 37 y.o.   MRN: [id number]

## 2018-01-03 NOTE — Plan of Care (Signed)
Pt asleep in his room all evening. No group or snack. Pt continues to say he is ready to be discharge. Pt denies SI/HI. Pt is receptive to treatment and safety maintained on unit. Will continue to monitor. Problem: Education: Goal: Will be free of psychotic symptoms Outcome: Progressing Goal: Knowledge of the prescribed therapeutic regimen will improve Outcome: Progressing   Problem: Coping: Goal: Will verbalize feelings Outcome: Progressing   Problem: Coping: Goal: Coping ability will improve Outcome: Progressing   Problem: Safety: Goal: Ability to remain free from injury will improve Outcome: Progressing   Problem: Safety: Goal: Ability to disclose and discuss suicidal ideas will improve Outcome: Progressing Goal: Ability to identify and utilize support systems that promote safety will improve Outcome: Progressing

## 2018-01-03 NOTE — BHH Group Notes (Signed)
LCSW Group Therapy Note  01/03/2018 1:15pm  Type of Therapy and Topic:  Group Therapy:  Fears and Unhealthy Coping Skills  Participation Level:  Minimal   Description of Group:  The focus of this group was to discuss some of the prevalent fears that patients experience, and to identify the commonalities among group members.  An exercise was used to initiate the discussion, followed by writing on the white board a group-generated list of unhealthy coping and healthy coping techniques to deal with each fear.    Therapeutic Goals: 1. Patient will identify and describe 3 fears they experience 2. Patient will identify one positive coping strategy for each fear they experience 3. Patient will respond empathically to peers statements regarding fears they experience  Summary of Patient Progress:  The patient reported he feels "okay." He stated "it is hard for me to deal with different issues, anger issues."     Therapeutic Modalities Cognitive Behavioral Therapy Motivational Interviewing  Ronna Herskowitz  CUEBAS-COLON, LCSW 01/03/2018 4:42 PM

## 2018-01-03 NOTE — Plan of Care (Signed)
Affect constricted.  Denies SI/HI/AVH.  Medication compliant although on self inventory states goal as "get off this medication, stay out of the hospital, go home".  Visible in the milieu although sets self apart for peers and no interaction noted. Minimal interaction with staff.  Verbalized that the shot he received earlier in the week is not agreeing with him. States that it is giving him the shakes and that he did not have them prior to receiving the shot.  Dr. Joseph ArtSubedi informed.  Support and encouragement offered. Safety rounds maintained.

## 2018-01-04 MED ORDER — DIPHENHYDRAMINE HCL 25 MG PO CAPS: 50.0000 mg | ORAL_CAPSULE | Freq: Four times a day (QID) | ORAL | Status: DC | PRN

## 2018-01-04 MED ORDER — DIPHENHYDRAMINE HCL 50 MG/ML IJ SOLN: 25.0000 mg | Freq: Four times a day (QID) | INTRAMUSCULAR | Status: DC | PRN

## 2018-01-04 NOTE — Progress Notes (Addendum)
[facility]  01/04/2018 12:47 PM Larry StarchKelly D Vaughn  MRN:  [id number]  Subjective:    I was stiff yesterday, but feel better today" Pt reports feeling better with cogentin, not willing to take invega shot.   denies AVH, Denies SI/HI Principal Problem: Undifferentiated schizophrenia (HCC) Diagnosis:   Patient Active Problem List   Diagnosis Date Noted  . Undifferentiated schizophrenia (HCC) [F20.3] 12/29/2017  . Cannabis use disorder, moderate, dependence (HCC) [F12.20] 12/29/2017  . Alcohol use disorder, moderate, dependence (HCC) [F10.20] 12/29/2017  . Tobacco use disorder [F17.200] 12/29/2017  . Intellectual disability [F79] 12/29/2017  . History of psychiatric disorder [Z86.59] 06/22/2013  . Respiratory failure (HCC) [J96.90] 06/21/2013  . Altered mental status [R41.82] 06/21/2013  . Alcohol intoxication with blood level over 0.3 [R78.0] 06/21/2013  . Acute respiratory failure (HCC) [J96.00] 06/21/2013   Total Time spent with patient: 25 min  Past Psychiatric History: schizophrenia  Past Medical History:  Past Medical History:  Diagnosis Date  . Schizophrenia (HCC)   . Seizures (HCC)    History reviewed. No pertinent surgical history. Family History: History reviewed. No pertinent family history. Family Psychiatric  History: none Social History:  Social History   Substance and Sexual Activity  Alcohol Use Yes   Comment: occ     Social History   Substance and Sexual Activity  Drug Use Yes  . Types: Marijuana    Social History   Socioeconomic History  . Marital status: Single    Spouse name: Not on file  . Number of children: Not on file  . Years of education: Not on file  . Highest education level: Not on file  Occupational History  . Not on file  Social Needs  . Financial resource strain: Not on file  . Food insecurity:    Worry: Not on file    Inability: Not on file  . Transportation needs:    Medical: Not on file    Non-medical: Not on file   Tobacco Use  . Smoking status: Current Every Day Smoker    Packs/day: 1.00    Types: Cigarettes  . Smokeless tobacco: Never Used  Substance and Sexual Activity  . Alcohol use: Yes    Comment: occ  . Drug use: Yes    Types: Marijuana  . Sexual activity: Not on file  Lifestyle  . Physical activity:    Days per week: Not on file    Minutes per session: Not on file  . Stress: Not on file  Relationships  . Social connections:    Talks on phone: Not on file    Gets together: Not on file    Attends religious service: Not on file    Active member of club or organization: Not on file    Attends meetings of clubs or organizations: Not on file    Relationship status: Not on file  Other Topics Concern  . Not on file  Social History Narrative  . Not on file   Additional Social History:                         Sleep: Fair  Appetite:  Fair  Current Medications: Current Facility-Administered Medications  Medication Dose Route Frequency Provider Last Rate Last Dose  . acetaminophen (TYLENOL) tablet 650 mg  650 mg Oral Q6H PRN Pucilowska, Jolanta B, MD   650 mg at 01/03/18 0859  . alum & mag hydroxide-simeth (MAALOX/MYLANTA) 200-200-20 MG/5ML suspension 30 mL  30 mL Oral Q4H PRN Pucilowska, Jolanta B, MD      . benztropine (COGENTIN) tablet 1 mg  1 mg Oral BID Beverly Sessions, MD   1 mg at 01/04/18 1610  . divalproex (DEPAKOTE) DR tablet 500 mg  500 mg Oral Q12H Pucilowska, Jolanta B, MD   500 mg at 01/04/18 0833  . docusate sodium (COLACE) capsule 100 mg  100 mg Oral Q12H Pucilowska, Jolanta B, MD   100 mg at 01/03/18 2214  . hydrOXYzine (ATARAX/VISTARIL) tablet 50 mg  50 mg Oral TID PRN Pucilowska, Jolanta B, MD      . magnesium hydroxide (MILK OF MAGNESIA) suspension 30 mL  30 mL Oral Daily PRN Pucilowska, Jolanta B, MD      . nicotine (NICODERM CQ - dosed in mg/24 hours) patch 21 mg  21 mg Transdermal Q0600 Pucilowska, Jolanta B, MD   21 mg at 01/04/18 0637  .  paliperidone (INVEGA SUSTENNA) injection 234 mg  234 mg Intramuscular Q28 days Pucilowska, Jolanta B, MD   234 mg at 12/31/17 1255  . traZODone (DESYREL) tablet 100 mg  100 mg Oral QHS Pucilowska, Jolanta B, MD   100 mg at 01/03/18 2214    Lab Results:  Results for orders placed or performed during the hospital encounter of 12/29/17 (from the past 48 hour(s))  Valproic acid level     Status: None   Collection Time: 01/03/18  6:53 AM  Result Value Ref Range   Valproic Acid Lvl 85 50.0 - 100.0 ug/mL    Comment: Performed at [facility], [address] [place], [place] [address]    Blood Alcohol level:  Lab Results  Component Value Date   ETH 52 (H) 12/29/2017   ETH 312 (H) 06/21/2013    Metabolic Disorder Labs: Lab Results  Component Value Date   HGBA1C 5.3 12/29/2017   MPG 105.41 12/29/2017   No results found for: PROLACTIN Lab Results  Component Value Date   CHOL 203 (H) 12/29/2017   TRIG 69 12/29/2017   HDL 59 12/29/2017   CHOLHDL 3.4 12/29/2017   VLDL 14 12/29/2017   LDLCALC 130 (H) 12/29/2017    Physical Findings: AIMS: Facial and Oral Movements Muscles of Facial Expression: None, normal Lips and Perioral Area: None, normal Jaw: None, normal Tongue: None, normal,Extremity Movements Upper (arms, wrists, hands, fingers): None, normal Lower (legs, knees, ankles, toes): None, normal, Trunk Movements Neck, shoulders, hips: None, normal, Overall Severity Severity of abnormal movements (highest score from questions above): None, normal Incapacitation due to abnormal movements: None, normal Patient's awareness of abnormal movements (rate only patient's report): No Awareness, Dental Status Current problems with teeth and/or dentures?: No Does patient usually wear dentures?: No  CIWA:  CIWA-Ar Total: 2 COWS:  COWS Total Score: 2  Musculoskeletal: Strength & Muscle Tone: within normal limits Gait & Station: normal Patient leans: N/A  Psychiatric  Specialty Exam: Physical Exam  Nursing note and vitals reviewed.   ROS  Blood pressure (!) 127/111, pulse (!) 101, temperature 97.7 F (36.5 C), temperature source Oral, resp. rate 18, height 5\' 7"  (1.702 m), weight 56.2 kg (124 lb), SpO2 100 %.Body mass index is 19.42 kg/m.  General Appearance: Casual  Eye Contact:  Good  Speech:  Clear and Coherent  Volume:  Normal  Mood: less anxious  Affect:  restricted  Thought Process:  Goal Directed and Descriptions of Associations: Intact  Orientation:  Full (Time, Place, and Person)  Thought Content:  meds  Suicidal  Thoughts:  No  Homicidal Thoughts:  No  Memory:  Immediate;   Fair Recent;   Fair Remote;   Fair  Judgement:  Poor  Insight:  Lacking  Psychomotor Activity:  Normal  Concentration:  Concentration: Fair and Attention Span: Fair  Recall:  [employer] of Knowledge:  Fair  Language:  Fair  Akathisia:  No  Handed:  Right  AIMS (if indicated):     Assets:  Communication Skills Desire for Improvement Financial Resources/Insurance Housing Physical Health Resilience Social Support  ADL's:  Intact  Cognition:  WNL  Sleep:  Number of Hours: 7.5     Treatment Plan Summary: Daily contact with patient to assess and evaluate symptoms and progress in treatment and Medication management   Mr. Steidle is a 37 year old male with a history of psychosis, mood instability and substance abuse admitted for psychotic break. VPA - 85.    -2nd Invega sustenna 156 mg planned on Sunday,pt not willing to take it due to EPS, hold it for now.  Cont standing cogentin, and add prn benadryl for EPS.  - Cont Depakote 500 mg BID,  -Trazodone 100 mg nightly          Beverly Sessions, MD 01/04/2018, 12:47 PMPatient ID: Larry Vaughn, male   DOB: [birth year], 37 y.o.   MRN: [id number] Patient ID: Larry Vaughn, male   DOB: 05/09/1981, 37 y.o.   MRN: [id number]

## 2018-01-04 NOTE — BHH Group Notes (Signed)
LCSW Group Therapy Note 01/04/2018 1:15pm  Type of Therapy and Topic: Group Therapy: Feelings Around Returning Home & Establishing a Supportive Framework and Supporting Oneself When Supports Not Available  Participation Level: Active  Description of Group:  Patients first processed thoughts and feelings about upcoming discharge. These included fears of upcoming changes, lack of change, new living environments, judgements and expectations from others and overall stigma of mental health issues. The group then discussed the definition of a supportive framework, what that looks and feels like, and how do to discern it from an unhealthy non-supportive network. The group identified different types of supports as well as what to do when your family/friends are less than helpful or unavailable  Therapeutic Goals  1. Patient will identify one healthy supportive network that they can use at discharge. 2. Patient will identify one factor of a supportive framework and how to tell it from an unhealthy network. 3. Patient able to identify one coping skill to use when they do not have positive supports from others. 4. Patient will demonstrate ability to communicate their needs through discussion and/or role plays.  Summary of Patient Progress:  The patient introduced himself and shared with the group something about himself. He stated "people take my kindness for weakness." Pt engaged during group session. As patients processed their anxiety about discharge and described healthy supports patient shared that he is ready to be discharge because he "is tired of being here." Pt reports that he is going to get off his psych meds once he is discharge. He indicated that the side effects of the medication are worse than the benefits. Pt listed his mom and dad as his main support system. Patients identified at least one self-care tool they were willing to use after discharge; listening to music.   Therapeutic  Modalities Cognitive Behavioral Therapy Motivational Interviewing   Jaber Dunlow  CUEBAS-COLON, LCSW 01/04/2018 12:40 PM

## 2018-01-04 NOTE — Plan of Care (Signed)
Patient is responding well to treatment regimen and socializing with peers, denies any concerns, cooperating with his ADLs and contracting for safety of self and others, patient denies SI/HI and no signs of AVH noted 15 minute rounding is maintained no distress. Problem: Education: Goal: Will be free of psychotic symptoms Outcome: Progressing Goal: Knowledge of the prescribed therapeutic regimen will improve Outcome: Progressing   Problem: Coping: Goal: Coping ability will improve Outcome: Progressing Goal: Will verbalize feelings Outcome: Progressing   Problem: Coping: Goal: Coping ability will improve Outcome: Progressing Goal: Will verbalize feelings Outcome: Progressing   Problem: Health Behavior/Discharge Planning: Goal: Ability to identify changes in lifestyle to reduce recurrence of condition will improve Outcome: Progressing Goal: Identification of resources available to assist in meeting health care needs will improve Outcome: Progressing   Problem: Safety: Goal: Ability to remain free from injury will improve Outcome: Progressing   Problem: Education: Goal: Knowledge of General Education information will improve Outcome: Progressing   Problem: Health Behavior/Discharge Planning: Goal: Ability to manage health-related needs will improve Outcome: Progressing   Problem: Safety: Goal: Ability to remain free from injury will improve Outcome: Progressing   Problem: Safety: Goal: Ability to disclose and discuss suicidal ideas will improve Outcome: Progressing Goal: Ability to identify and utilize support systems that promote safety will improve Outcome: Progressing

## 2018-01-04 NOTE — BHH Group Notes (Signed)
BHH Group Notes:  (Nursing/MHT/Case Management/Adjunct)  Date:  01/04/2018  Time:  10:19 PM  Type of Therapy:  Group Therapy  Participation Level:  Active  Participation Quality:  Appropriate  Affect:  Appropriate  Cognitive:  Appropriate  Insight:  Appropriate  Engagement in Group:  Engaged  Modes of Intervention:  Discussion  Summary of Progress/Problems:  Burt [name] Marie Deaven Barron 01/04/2018, 10:19 PM

## 2018-01-04 NOTE — Plan of Care (Signed)
Presents with flat affect.  Speech logical and coherent.  Visible in the milieu although no interaction noted with peers.  Denies SI/HI/AVH.  Paces halls at times.  Support and encouragement offered.  Safety rounds maintained.   Problem: Education: Goal: Will be free of psychotic symptoms Outcome: Progressing Goal: Knowledge of the prescribed therapeutic regimen will improve Outcome: Progressing   Problem: Coping: Goal: Coping ability will improve Outcome: Progressing Goal: Will verbalize feelings Outcome: Progressing   Problem: Safety: Goal: Ability to remain free from injury will improve Outcome: Progressing

## 2018-01-05 LAB — VALPROIC ACID LEVEL: Valproic Acid Lvl: 121 ug/mL — ABNORMAL HIGH (ref 50.0–100.0)

## 2018-01-05 LAB — AMMONIA: Ammonia: 37 umol/L — ABNORMAL HIGH (ref 9–35)

## 2018-01-05 MED ORDER — BENZTROPINE MESYLATE 1 MG PO TABS: 1.0000 mg | ORAL_TABLET | Freq: Two times a day (BID) | ORAL | 1 refills | Status: DC

## 2018-01-05 MED ORDER — TRAZODONE HCL 100 MG PO TABS: 100.0000 mg | ORAL_TABLET | Freq: Every day | ORAL | 1 refills | Status: DC

## 2018-01-05 MED ORDER — PALIPERIDONE PALMITATE ER 156 MG/ML IM SUSY: 156.0000 mg | PREFILLED_SYRINGE | INTRAMUSCULAR | 1 refills | Status: DC

## 2018-01-05 MED ORDER — PALIPERIDONE PALMITATE ER 156 MG/ML IM SUSY
156.0000 mg | PREFILLED_SYRINGE | INTRAMUSCULAR | Status: DC
  Administered 2018-01-05: 156 mg via INTRAMUSCULAR
  Filled 2018-01-05 (×2): qty 1

## 2018-01-05 NOTE — [facility] Suicide Risk Assessment (Signed)
[facility]   Principal Problem: Undifferentiated schizophrenia [facility](HCC) Discharge Diagnoses:  Patient Active Problem List   Diagnosis Date Noted  . Undifferentiated schizophrenia (HCC) [F20.3] 12/29/2017    Priority: High  . Cannabis use disorder, moderate, dependence (HCC) [F12.20] 12/29/2017  . Alcohol use disorder, moderate, dependence (HCC) [F10.20] 12/29/2017  . Tobacco use disorder [F17.200] 12/29/2017  . Intellectual disability [F79] 12/29/2017  . History of psychiatric disorder [Z86.59] 06/22/2013  . Respiratory failure (HCC) [J96.90] 06/21/2013  . Altered mental status [R41.82] 06/21/2013  . Alcohol intoxication with blood level over 0.3 [R78.0] 06/21/2013  . Acute respiratory failure (HCC) [J96.00] 06/21/2013    Total Time spent with patient: 20 minutes  Musculoskeletal: Strength & Muscle Tone: within normal limits Gait & Station: normal Patient leans: N/A  Psychiatric Specialty Exam: Review of Systems  Neurological: Negative.   Psychiatric/Behavioral: Negative.   All other systems reviewed and are negative.   Blood pressure 111/71, pulse 84, temperature 97.7 F (36.5 C), temperature source Oral, resp. rate 16, height 5\' 7"  (1.702 m), weight 56.2 kg (124 lb), SpO2 100 %.Body mass index is 19.42 kg/m.  General Appearance: Casual  Eye Contact::  Good  Speech:  Clear and Coherent409  Volume:  Normal  Mood:  Euthymic  Affect:  Appropriate  Thought Process:  Goal Directed and Descriptions of Associations: Intact  Orientation:  Full (Time, Place, and Person)  Thought Content:  WDL  Suicidal Thoughts:  No  Homicidal Thoughts:  No  Memory:  Immediate;   Fair Recent;   Fair Remote;   Fair  Judgement:  Poor  Insight:  Shallow  Psychomotor Activity:  Normal  Concentration:  Fair  Recall:  [employer]  Fund of Knowledge:Fair  Language: Fair  Akathisia:  No  Handed:  Right  AIMS (if indicated):     Assets:  Communication Skills Desire for  Improvement Financial Resources/Insurance Housing Physical Health Resilience Social Support  Sleep:  Number of Hours: 6.3  Cognition: WNL  ADL's:  Intact   Mental Status Per Nursing Assessment::   On Admission:  Thoughts of violence towards others  Demographic Factors:  Male and Low socioeconomic status  Loss Factors: Legal issues and Financial problems/change in socioeconomic status  Historical Factors: Prior suicide attempts, Family history of mental illness or substance abuse and Impulsivity  Risk Reduction Factors:   Sense of responsibility to family, Living with another person, especially a relative and Positive social support  Continued Clinical Symptoms:  Alcohol/Substance Abuse/Dependencies Schizophrenia:   Less than 38 years old Paranoid or undifferentiated type  Cognitive Features That Contribute To Risk:  None    Suicide Risk:  Minimal: No identifiable suicidal ideation.  Patients presenting with no risk factors but with morbid ruminations; may be classified as minimal risk based on the severity of the depressive symptoms  Follow-up Information    [facility] Follow up on 01/06/2018.   Why:  Your follow-up appointment is scheduled for January 06, 2018 at 9:30AM.  Please bring a photo ID, medications, and insurance cards.  Thank you. Contact information: [address] [place] [address] [phone]           Plan Of Care/Follow-up recommendations:  Activity:  as tolerated Diet:  low sodium heart healthy Other:  keep follow up appointments  Kristine LineaJolanta Tiffancy Moger, MD 01/05/2018, 11:34 AM

## 2018-01-05 NOTE — Progress Notes (Signed)
Patient ID: Larry FesterKelly Dwayne Vaughn, male   DOB: Oct 02, 1980, 37 y.o.   MRN: [id number]   Discharge Note:  Patient denies SI/HI/AVH at this time. Discharge instructions, AVS, transition record, and prescriptions gone over with patient. Patient agrees to comply with medication management, follow-up visit, and outpatient therapy. Patient belongings returned to patient. Patient questions and concerns addressed and answered. Patient ambulatory off unit. Patient discharged to home with parents.

## 2018-01-05 NOTE — Discharge Summary (Signed)
Physician Discharge Summary Note  Patient:  Larry Vaughn is an 37 y.o., male MRN:  [id number] DOB:  11-12-1980 Patient phone:  [phone] (home)  Patient address:   [address] [place] [address],  Total Time spent with patient: 20 minutes plus 15 min on care coordination and documantation.  Date of Admission:  12/29/2017 Date of Discharge: 01/05/2018  Reason for Admission:  Psychotic break.  History of Present Illness:   Identifying data. Larry Vaughn is a 37 year old male with a history of psychosis and substance abuse.  Chief complaint. "Larry Vaughn is nothing wrong with me."  History of present illness. Information was obtained from the patient and the chart. The patient was brought to the ER by the police arrested for stalking a neighbor who already has 50C on him. The patient reports that, while in his parents house, he could hear this woman calling him, have sex-talk with him and inviting him over to her house under the pretense that she needed a questionnaire. He never saw her but could clearly hear her voice.He went to her house, she asked him to leave and called the police. The patient denies any symptoms of depression, anxiety or psychosis but during longer conversation admits that he was diagnosed and disabled from schizophrenia and that he does hear voices "sometimes". Unfortunately, he seems to be obsessed with the neighbor. He was surprised that she did not want to have sex with him. He admits that he has not been compliant with treatment because he is unable to afford medications. He also has been drinking "four beers".   Past psychiatric history. He has developmental disability and schizophrenia. He has been off medications since he was released from prison. Apparently, receive d medication in prison. He has been admitted to [facility] several times for psychosis and alcohol related problems.  Family psychiatric history. None reported.  Social history. He was in  special education classes. He was in prison for 6 years. Following release, he has not been getting his disability check. He lives with his parents. He does have Medicaid  Principal Problem: Undifferentiated schizophrenia [facility]) Discharge Diagnoses: Patient Active Problem List   Diagnosis Date Noted  . Undifferentiated schizophrenia (HCC) [F20.3] 12/29/2017    Priority: High  . Cannabis use disorder, moderate, dependence (HCC) [F12.20] 12/29/2017  . Alcohol use disorder, moderate, dependence (HCC) [F10.20] 12/29/2017  . Tobacco use disorder [F17.200] 12/29/2017  . Intellectual disability [F79] 12/29/2017  . History of psychiatric disorder [Z86.59] 06/22/2013  . Respiratory failure (HCC) [J96.90] 06/21/2013  . Altered mental status [R41.82] 06/21/2013  . Alcohol intoxication with blood level over 0.3 [R78.0] 06/21/2013  . Acute respiratory failure (HCC) [J96.00] 06/21/2013    Past Medical History:  Past Medical History:  Diagnosis Date  . Schizophrenia (HCC)   . Seizures (HCC)    History reviewed. No pertinent surgical history. Family History: History reviewed. No pertinent family history.  Social History:  Social History   Substance and Sexual Activity  Alcohol Use Yes   Comment: occ     Social History   Substance and Sexual Activity  Drug Use Yes  . Types: Marijuana    Social History   Socioeconomic History  . Marital status: Single    Spouse name: Not on file  . Number of children: Not on file  . Years of education: Not on file  . Highest education level: Not on file  Occupational History  . Not on file  Social Needs  . Financial resource strain:  Not on file  . Food insecurity:    Worry: Not on file    Inability: Not on file  . Transportation needs:    Medical: Not on file    Non-medical: Not on file  Tobacco Use  . Smoking status: Current Every Day Smoker    Packs/day: 1.00    Types: Cigarettes  . Smokeless tobacco: Never Used  Substance and Sexual  Activity  . Alcohol use: Yes    Comment: occ  . Drug use: Yes    Types: Marijuana  . Sexual activity: Not on file  Lifestyle  . Physical activity:    Days per week: Not on file    Minutes per session: Not on file  . Stress: Not on file  Relationships  . Social connections:    Talks on phone: Not on file    Gets together: Not on file    Attends religious service: Not on file    Active member of club or organization: Not on file    Attends meetings of clubs or organizations: Not on file    Relationship status: Not on file  Other Topics Concern  . Not on file  Social History Narrative  . Not on file    Hospital Course:    Larry Vaughn is a 37 year old male with a history of psychosis and substance abuse admitted for psychotic break. He was started on [place] and given both initial dose of Invega sustenna. There was some stiffness and Cogentin was added to his regimen. Unfortunately, the patient could not tolerate Depakote due to elevated ammonia. At the time of discharge, he was no longer psychotic, suicidal or homicidal.   #Mood/psychosis, resolved -continue Invega sustenna 156 mg monthly injections -continue Trazodone 100 mg nightly  #Substance abuse -positive for cannabisand alcohol -minimizes problems and declines treatment  #Smoking cassation -nicotine patch was available  #Labs -lipid panel, TSH and A1Care unremarkable -EKGreviewed, NSR with QTc 415  #Disposition -discharge with family -follow up with [facility] in [facility]  Physical Findings: AIMS: Facial and Oral Movements Muscles of Facial Expression: None, normal Lips and Perioral Area: None, normal Jaw: None, normal Tongue: None, normal,Extremity Movements Upper (arms, wrists, hands, fingers): None, normal Lower (legs, knees, ankles, toes): None, normal, Trunk Movements Neck, shoulders, hips: None, normal, Overall Severity Severity of abnormal movements (highest score from questions above): None,  normal Incapacitation due to abnormal movements: None, normal Patient's awareness of abnormal movements (rate only patient's report): No Awareness, Dental Status Current problems with teeth and/or dentures?: No Does patient usually wear dentures?: No  CIWA:  CIWA-Ar Total: 2 COWS:  COWS Total Score: 2  Musculoskeletal: Strength & Muscle Tone: within normal limits Gait & Station: normal Patient leans: N/A  Psychiatric Specialty Exam: Physical Exam  Nursing note and vitals reviewed. Psychiatric: He has a normal mood and affect. His speech is normal and behavior is normal. Thought content normal. Cognition and memory are normal. He expresses impulsivity.    Review of Systems  Neurological: Negative.   Psychiatric/Behavioral: Positive for substance abuse.  All other systems reviewed and are negative.   Blood pressure 111/71, pulse 84, temperature 97.7 F (36.5 C), temperature source Oral, resp. rate 16, height 5\' 7"  (1.702 m), weight 56.2 kg (124 lb), SpO2 100 %.Body mass index is 19.42 kg/m.  General Appearance: Casual  Eye Contact:  Good  Speech:  Clear and Coherent  Volume:  Normal  Mood:  Euthymic  Affect:  Appropriate  Thought Process:  Goal Directed  and Descriptions of Associations: Intact  Orientation:  Full (Time, Place, and Person)  Thought Content:  WDL  Suicidal Thoughts:  No  Homicidal Thoughts:  No  Memory:  Immediate;   Fair Recent;   Fair Remote;   Fair  Judgement:  Poor  Insight:  Shallow  Psychomotor Activity:  Normal  Concentration:  Concentration: Fair and Attention Span: Fair  Recall:  F[employer]Fair  Fund of Knowledge:  Fair  Language:  Fair  Akathisia:  No  Handed:  Right  AIMS (if indicated):     Assets:  Communication Skills Desire for Improvement Financial Resources/Insurance Housing Physical Health Resilience Social Support  ADL's:  Intact  Cognition:  WNL  Sleep:  Number of Hours: 6.3     Have you used any form of tobacco in the last 30  days? (Cigarettes, Smokeless Tobacco, Cigars, and/or Pipes): Yes  Has this patient used any form of tobacco in the last 30 days? (Cigarettes, Smokeless Tobacco, Cigars, and/or Pipes) Yes, Yes, A prescription for an FDA-approved tobacco cessation medication was offered at discharge and the patient refused  Blood Alcohol level:  Lab Results  Component Value Date   ETH 52 (H) 12/29/2017   ETH 312 (H) 06/21/2013    Metabolic Disorder Labs:  Lab Results  Component Value Date   HGBA1C 5.3 12/29/2017   MPG 105.41 12/29/2017   No results found for: PROLACTIN Lab Results  Component Value Date   CHOL 203 (H) 12/29/2017   TRIG 69 12/29/2017   HDL 59 12/29/2017   CHOLHDL 3.4 12/29/2017   VLDL 14 12/29/2017   LDLCALC 130 (H) 12/29/2017    See Psychiatric Specialty Exam and Suicide Risk Assessment completed by Attending Physician prior to discharge.  Discharge destination:  Home  Is patient on multiple antipsychotic therapies at discharge:  No   Has Patient had three or more failed trials of antipsychotic monotherapy by history:  No  Recommended Plan for Multiple Antipsychotic Therapies: NA  Discharge Instructions    Diet - low sodium heart healthy   Complete by:  As directed    Diet - low sodium heart healthy   Complete by:  As directed    Increase activity slowly   Complete by:  As directed    Increase activity slowly   Complete by:  As directed      Allergies as of 01/05/2018      Reactions   Other    Pt says is allergic to a medication but doesn't know what it is.  Pt says when he took it, he had stiffness in his body and was unable to walk       Medication List    STOP taking these medications   docusate sodium 100 MG capsule Commonly known as:  COLACE     TAKE these medications     Indication  benztropine 1 MG tablet Commonly known as:  COGENTIN Take 1 tablet (1 mg total) by mouth 2 (two) times daily.  Indication:  Extrapyramidal Reaction caused by  Medications   paliperidone 156 MG/ML Susy injection Commonly known as:  INVEGA SUSTENNA Inject 1 mL (156 mg total) into the muscle every 28 (twenty-eight) days. Next dose on 02/02/2018  Indication:  Schizophrenia   traZODone 100 MG tablet Commonly known as:  DESYREL Take 1 tablet (100 mg total) by mouth at bedtime.  Indication:  Trouble Sleeping      Follow-up Information    [facility] Follow up on 01/06/2018.  Why:  Your follow-up appointment is scheduled for January 06, 2018 at 9:30AM.  Please bring a photo ID, medications, and insurance cards.  Thank you. Contact information: [address] [place] [address] [phone]           Follow-up recommendations:  Activity:  as tolerated Diet:  low sodium heart healthy Other:  keep follow up appointments  Comments:    Signed: Kristine Linea, MD 01/05/2018, 11:38 AM

## 2018-01-05 NOTE — Progress Notes (Signed)
  [facility] :  Will you be returning to the same living situation after discharge:  Yes,  returning home. At discharge, do you have transportation home?: Yes,  family Do you have the ability to pay for your medications: Yes,  [facility]  Release of information consent forms completed and in the chart;  Patient's signature needed at discharge.  Patient to Follow up at: Follow-up Information    [facility] Follow up on 01/06/2018.   Why:  Your follow-up appointment is scheduled for January 06, 2018 at 9:30AM.  Please bring a photo ID, medications, and insurance cards.  Thank you. Contact information: [address] [place] [address] [phone]           Next level of care provider [facility] Link:no  Safety Planning and Suicide Prevention discussed: Yes,  with pt. CSW unable to reach pt's family members.  Have you used any form of tobacco in the last 30 days? (Cigarettes, Smokeless Tobacco, Cigars, and/or Pipes): Yes  Has patient been referred to the Quitline?: Patient refused referral  Patient has been referred for addiction treatment: N/A  Heidi DachKelsey Mavis Gravelle, LCSW 01/05/2018, 9:52 AM

## 2018-01-05 NOTE — Progress Notes (Signed)
Recreation Therapy Notes   Date: 01/05/2018  Time: 9:30 am  Location: Craft Room  Behavioral response: Appropriate   Intervention Topic: Happiness  Discussion/Intervention:  Group content today was focused on Happiness. The group defined happiness and stated reasons they are and are not happy at times. Participants identified reasons they are normally happy and why. Individuals expressed how not being happy affects themselves and others. Patients stated reasons why happiness is important to them. The group described how they feel when they are happy. Individuals participated in the intervention "What is happiness" where they defined what happiness means to them.   Clinical Observations/Feedback:  Patient came to group and stated that sometimes his family messes with his happiness. He expressed that others will know that he is happy because he will play and joke. Individual explained that it is important for him to be happy because it helps his body. Patient was social with peers and staff while participating in the intervention during group. Individual appeared to be responding to internal stimuli during group.  Larry Vaughn LRT/CTRS         [name] 01/05/2018 12:08 PM

## 2018-01-05 NOTE — Progress Notes (Signed)
Recreation Therapy Notes  INPATIENT RECREATION TR PLAN  Patient Details Name: Larry Vaughn MRN: [id number] DOB: 09/20/1980 Today's Date: 01/05/2018  Rec Therapy Plan Is patient appropriate for Therapeutic Recreation?: Yes Treatment times per week: at least 3 Estimated Length of Stay: 5-7 days TR Treatment/Interventions: Group participation (Comment)  Discharge Criteria Pt will be discharged from therapy if:: Discharged Treatment plan/goals/alternatives discussed and agreed upon by:: Patient/family  Discharge Summary Short term goals set:  Patient will identify benefit of making healthy decisions post d/c within 5 recreation therapy group sessions Short term goals met: Complete Progress toward goals comments: Groups attended Which groups?: Stress management, Communication, AAA/T, Other (Comment)(Happiness) Reason goals not met: N/A Therapeutic equipment acquired: N/A Reason patient discharged from therapy: Discharge from hospital Pt/family agrees with progress & goals achieved: Yes Date patient discharged from therapy: 01/05/18      01/05/2018, 1:29 PM  

## 2018-01-21 ENCOUNTER — Emergency Department
Admission: EM | Admit: 2018-01-21 | Discharge: 2018-01-21 | Disposition: A | Discharge status: Home / Self Care | Payer: Medicare Other | Attending: Emergency Medicine | Admitting: Emergency Medicine

## 2018-01-21 ENCOUNTER — Encounter: Payer: Self-pay | Admitting: Emergency Medicine

## 2018-01-21 DIAGNOSIS — R519 Headache, unspecified: Secondary | ICD-10-CM

## 2018-01-21 DIAGNOSIS — Z79899 Other long term (current) drug therapy: Secondary | ICD-10-CM | POA: Diagnosis not present

## 2018-01-21 DIAGNOSIS — R51 Headache: Secondary | ICD-10-CM | POA: Insufficient documentation

## 2018-01-21 DIAGNOSIS — F17210 Nicotine dependence, cigarettes, uncomplicated: Secondary | ICD-10-CM | POA: Insufficient documentation

## 2018-01-21 MED ORDER — BUTALBITAL-APAP-CAFFEINE 50-325-40 MG PO TABS: 1.0000 | ORAL_TABLET | Freq: Four times a day (QID) | ORAL | 0 refills | Status: AC | PRN

## 2018-01-21 MED ORDER — BUTALBITAL-APAP-CAFFEINE 50-325-40 MG PO TABS
1.0000 | ORAL_TABLET | Freq: Once | ORAL | Status: AC
  Administered 2018-01-21: 1 via ORAL
  Filled 2018-01-21: qty 1

## 2018-01-21 NOTE — ED Provider Notes (Signed)
[facility] Emergency Department Provider Note  ____________________________________________   First MD Initiated Contact with Patient 01/21/18 [id number]     (approximate)  I have reviewed the triage vital signs and the nursing notes.   HISTORY  Chief Complaint Migraine  HPI Larry Vaughn is a 37 y.o. male is here complaining of "migraine" for 1 week.  Patient denies any previous diagnosis of migraine.  He reports that his headache is in the middle of his head and is relieved with Tylenol.  Patient denies any visual changes, nausea, vomiting or fever.  There is been no history of tick bites.  He denies any fever or chills.  He denies any URI symptoms.  He rates his pain as 5/10.   Past Medical History:  Diagnosis Date  . Schizophrenia (HCC)   . Seizures [facility])     Patient Active Problem List   Diagnosis Date Noted  . Undifferentiated schizophrenia (HCC) 12/29/2017  . Cannabis use disorder, moderate, dependence (HCC) 12/29/2017  . Alcohol use disorder, moderate, dependence (HCC) 12/29/2017  . Tobacco use disorder 12/29/2017  . Intellectual disability 12/29/2017  . History of psychiatric disorder 06/22/2013  . Respiratory failure (HCC) 06/21/2013  . Altered mental status 06/21/2013  . Alcohol intoxication with blood level over 0.3 06/21/2013  . Acute respiratory failure (HCC) 06/21/2013    History reviewed. No pertinent surgical history.  Prior to Admission medications   Medication Sig Start Date End Date Taking? Authorizing Provider  benztropine (COGENTIN) 1 MG tablet Take 1 tablet (1 mg total) by mouth 2 (two) times daily. 01/05/18   Pucilowska, Jolanta B, MD  butalbital-acetaminophen-caffeine (FIORICET, ESGIC) 50-325-40 MG tablet Take 1 tablet by mouth every 6 (six) hours as needed for headache. 01/21/18 01/21/19  Tommi Rumps, PA-C  paliperidone (INVEGA SUSTENNA) 156 MG/ML SUSY injection Inject 1 mL (156 mg total) into the muscle every 28  (twenty-eight) days. Next dose on 02/02/2018 01/05/18   Pucilowska, Braulio Conte B, MD  traZODone (DESYREL) 100 MG tablet Take 1 tablet (100 mg total) by mouth at bedtime. 01/05/18   Pucilowska, Ellin Goodie, MD    Allergies Other  No family history on file.  Social History Social History   Tobacco Use  . Smoking status: Current Every Day Smoker    Packs/day: 1.00    Types: Cigarettes  . Smokeless tobacco: Never Used  Substance Use Topics  . Alcohol use: Yes    Comment: occ  . Drug use: Yes    Types: Marijuana    Review of Systems Constitutional: No fever/chills Eyes: No visual changes.  No photophobia. ENT: No sore throat.  Needed for ear pain. Cardiovascular: Denies chest pain. Respiratory: Denies shortness of breath. Gastrointestinal:   No nausea, no vomiting. Musculoskeletal: Negative for muscle aches. Skin: Negative for rash. Neurological: Positive f for headaches, negative for focal weakness or numbness. ____________________________________________   PHYSICAL EXAM:  VITAL SIGNS: ED Triage Vitals  Enc Vitals Group     BP 01/21/18 0834 (!) 117/54     Pulse Rate 01/21/18 0834 62     Resp 01/21/18 0834 20     Temp 01/21/18 0834 (!) 97.5 F (36.4 C)     Temp Source 01/21/18 0834 Oral     SpO2 01/21/18 0834 100 %     Weight 01/21/18 0835 137 lb (62.1 kg)     Height 01/21/18 0835 5\' 7"  (1.702 m)     Head Circumference --      Peak Flow --  Pain Score 01/21/18 0834 5     Pain Loc --      Pain Edu? --      Excl. in GC? --    Constitutional: Alert and oriented. Well appearing and in no acute distress.  Eyes: Conjunctivae are normal. PERRL. EOMI. no photophobia. Head: Atraumatic. Nose: No congestion/rhinnorhea.  TMs are dull with normal light reflex.  No erythema or injection is noted. Mouth/Throat: Mucous membranes are moist.  Oropharynx non-erythematous. Neck: No stridor.  No cervical tenderness on palpation posteriorly.  Range of motion is without restriction or  pain.  Patient is able to flex and extend without any difficulties.  Is minimally tender across the trapezius muscles on palpation. Hematological/Lymphatic/Immunilogical: No cervical lymphadenopathy. Cardiovascular: Normal rate, regular rhythm. Grossly normal heart sounds.  Good peripheral circulation. Respiratory: Normal respiratory effort.  No retractions. Lungs CTAB. Musculoskeletal: Moves upper and lower extremities without any difficulty normal gait was noted.  Good muscle strength bilaterally. Neurologic:  Normal speech and language. No gross focal neurologic deficits are appreciated.  Cranial nerves II through XII grossly intact.  ~Reflexes are 2+ bilaterally.  No gait instability. Skin:  Skin is warm, dry and intact. No rash noted. Psychiatric: Mood and affect are normal. Speech and behavior are normal.  ____________________________________________   LABS (all labs ordered are listed, but only abnormal results are displayed)  Labs Reviewed - No data to display  PROCEDURES  Procedure(s) performed: None  Procedures  Critical Care performed: No  ____________________________________________   INITIAL IMPRESSION / ASSESSMENT AND PLAN / ED COURSE  As part of my medical decision making, I reviewed the following data within the electronic MEDICAL RECORD NUMBER Notes from prior ED visits and [place] Controlled Substance Database  Patient was given Fioricet while in the department along with a prescription for 8 tablets.  Patient continued to improve.  In looking through his records there is not been a diagnosis of migraines made.  CT scan from 2015 was reviewed and was noted to be negative.  [facility] any [facility].  ____________________________________________   FINAL CLINICAL IMPRESSION(S) / ED DIAGNOSES  Final diagnoses:  Headache above the eye region     ED Discharge Orders        Ordered    butalbital-acetaminophen-caffeine  (FIORICET, ESGIC) 50-325-40 MG tablet  Every 6 hours PRN     01/21/18 0935       Note:  This document was prepared using Dragon voice recognition software and may include unintentional dictation errors.    Tommi RumpsSummers, Steed Kanaan L, PA-C 01/21/18 1108    Sharman CheekStafford, Phillip, MD 01/22/18 [id number]

## 2018-01-21 NOTE — ED Triage Notes (Signed)
Pt reports migraine for a week, denies hx of migraines. Reports has taken OTC meds with no relief. Denies any other sx. Denies blurred vision, NV.

## 2018-01-21 NOTE — Discharge Instructions (Signed)
[facility] if any continued problems.  Take medication only as directed.  This medication could cause drowsiness.  Fioricet 1 every 6 hours as needed for headache.  Increase fluids.  Return to the ED over the holiday if any severe worsening of your symptoms.

## 2018-01-21 NOTE — ED Notes (Signed)
See triage note  Presents with headache   States headache is mid forehead   Pain started about 1 week ago  No fever ,n/v or blurred vision  Also denies any photosensitivity

## 2020-01-14 ENCOUNTER — Emergency Department (HOSPITAL_COMMUNITY): Payer: Medicare Other

## 2020-01-14 ENCOUNTER — Encounter (HOSPITAL_COMMUNITY): Payer: Self-pay | Admitting: Emergency Medicine

## 2020-01-14 ENCOUNTER — Emergency Department (HOSPITAL_COMMUNITY)
Admission: EM | Admit: 2020-01-14 | Discharge: 2020-01-14 | Disposition: A | Discharge status: Home / Self Care | Payer: Medicare Other | Attending: Emergency Medicine | Admitting: Emergency Medicine

## 2020-01-14 ENCOUNTER — Other Ambulatory Visit: Payer: Self-pay

## 2020-01-14 DIAGNOSIS — M25572 Pain in left ankle and joints of left foot: Secondary | ICD-10-CM | POA: Insufficient documentation

## 2020-01-14 DIAGNOSIS — Y92838 Other recreation area as the place of occurrence of the external cause: Secondary | ICD-10-CM | POA: Diagnosis not present

## 2020-01-14 DIAGNOSIS — R569 Unspecified convulsions: Secondary | ICD-10-CM | POA: Diagnosis not present

## 2020-01-14 DIAGNOSIS — S90512A Abrasion, left ankle, initial encounter: Secondary | ICD-10-CM | POA: Diagnosis not present

## 2020-01-14 DIAGNOSIS — Y939 Activity, unspecified: Secondary | ICD-10-CM | POA: Insufficient documentation

## 2020-01-14 DIAGNOSIS — F209 Schizophrenia, unspecified: Secondary | ICD-10-CM | POA: Diagnosis not present

## 2020-01-14 DIAGNOSIS — S60222A Contusion of left hand, initial encounter: Secondary | ICD-10-CM

## 2020-01-14 DIAGNOSIS — Y999 Unspecified external cause status: Secondary | ICD-10-CM | POA: Insufficient documentation

## 2020-01-14 DIAGNOSIS — Z23 Encounter for immunization: Secondary | ICD-10-CM | POA: Diagnosis not present

## 2020-01-14 DIAGNOSIS — L089 Local infection of the skin and subcutaneous tissue, unspecified: Secondary | ICD-10-CM

## 2020-01-14 DIAGNOSIS — F17210 Nicotine dependence, cigarettes, uncomplicated: Secondary | ICD-10-CM | POA: Diagnosis not present

## 2020-01-14 DIAGNOSIS — S99912A Unspecified injury of left ankle, initial encounter: Secondary | ICD-10-CM | POA: Diagnosis present

## 2020-01-14 MED ORDER — TETANUS-DIPHTH-ACELL PERTUSSIS 5-2.5-18.5 LF-MCG/0.5 IM SUSP
0.5000 mL | Freq: Once | INTRAMUSCULAR | Status: AC
  Administered 2020-01-14: 0.5 mL via INTRAMUSCULAR
  Filled 2020-01-14: qty 0.5

## 2020-01-14 NOTE — ED Notes (Signed)
Pt ambulatory to waiting room. Pt verbalized understanding of discharge instructions.   

## 2020-01-14 NOTE — Discharge Instructions (Signed)
Clean the abrasion daily with soap and water. Take ibuprofen 600 mg and/or acetaminophen 650 mg 4 times a day for pain. You can be rechecked by Dr Romeo Apple, the orthopedist on call if still painful in a week. Call his office for an appointment.
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
## 2020-01-19 ENCOUNTER — Encounter: Payer: Self-pay | Admitting: Orthopedic Surgery

## 2020-01-19 ENCOUNTER — Other Ambulatory Visit: Payer: Self-pay

## 2020-01-19 ENCOUNTER — Ambulatory Visit (INDEPENDENT_AMBULATORY_CARE_PROVIDER_SITE_OTHER): Payer: Medicare Other | Admitting: Orthopedic Surgery

## 2020-01-19 VITALS — Ht 67.5 in

## 2020-01-19 DIAGNOSIS — M79642 Pain in left hand: Secondary | ICD-10-CM | POA: Diagnosis not present

## 2020-01-19 DIAGNOSIS — S90512A Abrasion, left ankle, initial encounter: Secondary | ICD-10-CM

## 2020-01-19 NOTE — Patient Instructions (Signed)
Wash the wound with soap and water, dry it and place a Band-Aid over it for the next 2 weeks
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
## 2020-02-02 ENCOUNTER — Other Ambulatory Visit: Payer: Self-pay

## 2020-02-02 ENCOUNTER — Encounter: Payer: Self-pay | Admitting: Orthopedic Surgery

## 2020-02-02 ENCOUNTER — Ambulatory Visit (INDEPENDENT_AMBULATORY_CARE_PROVIDER_SITE_OTHER): Payer: Medicare Other | Admitting: Orthopedic Surgery

## 2020-02-02 VITALS — Ht 67.5 in | Wt 123.0 lb

## 2020-02-02 DIAGNOSIS — M79642 Pain in left hand: Secondary | ICD-10-CM

## 2020-02-02 DIAGNOSIS — S90512D Abrasion, left ankle, subsequent encounter: Secondary | ICD-10-CM

## 2020-02-02 NOTE — Progress Notes (Signed)
Chief Complaint  Patient presents with  . Wound Check    Lt hand and lt ankle    Encounter Diagnoses  Name Primary?  . Hand pain, left, acute Yes  . Abrasion of left ankle without infection, subsequent encounter     Ht 5' 7.5" (1.715 m)   Wt 123 lb (55.8 kg)   BMI 18.98 kg/m   Larry Vaughn  has done well in terms of his ankle wound and his finger is doing fine with full range of motion  We are releasing him to follow-up as needed

## 2020-11-22 ENCOUNTER — Encounter (HOSPITAL_COMMUNITY): Payer: Self-pay | Admitting: Emergency Medicine

## 2020-11-22 ENCOUNTER — Other Ambulatory Visit: Payer: Self-pay

## 2020-11-22 ENCOUNTER — Emergency Department (HOSPITAL_COMMUNITY)
Admission: EM | Admit: 2020-11-22 | Discharge: 2020-11-22 | Disposition: A | Discharge status: Home / Self Care | Payer: Medicare Other | Attending: Emergency Medicine | Admitting: Emergency Medicine

## 2020-11-22 DIAGNOSIS — S0592XA Unspecified injury of left eye and orbit, initial encounter: Secondary | ICD-10-CM | POA: Diagnosis present

## 2020-11-22 DIAGNOSIS — F17210 Nicotine dependence, cigarettes, uncomplicated: Secondary | ICD-10-CM | POA: Insufficient documentation

## 2020-11-22 DIAGNOSIS — S0502XA Injury of conjunctiva and corneal abrasion without foreign body, left eye, initial encounter: Secondary | ICD-10-CM | POA: Diagnosis not present

## 2020-11-22 MED ORDER — ERYTHROMYCIN 5 MG/GM OP OINT: TOPICAL_OINTMENT | OPHTHALMIC | 0 refills | Status: AC

## 2020-11-22 MED ORDER — FLUORESCEIN SODIUM 1 MG OP STRP
1.0000 | ORAL_STRIP | Freq: Once | OPHTHALMIC | Status: AC
  Administered 2020-11-22: 1 via OPHTHALMIC
  Filled 2020-11-22: qty 1
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
## 2020-11-22 NOTE — Discharge Instructions (Signed)
You have been seen and discharged from the emergency department.  You were found to have a corneal abrasion.  Use the antibiotic ointment as directed.  Follow-up with ophthalmology to ensure that this heals correctly.  Follow-up with your primary provider for reevaluation and further care. Take home medications as prescribed. If you have any worsening symptoms, worsening eye pain, vision changes or further concerns for your health please return to an emergency department for further evaluation.
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
## 2020-11-26 ENCOUNTER — Encounter (HOSPITAL_COMMUNITY): Payer: Self-pay | Admitting: Emergency Medicine

## 2020-11-26 ENCOUNTER — Other Ambulatory Visit: Payer: Self-pay

## 2020-11-26 ENCOUNTER — Emergency Department (HOSPITAL_COMMUNITY)
Admission: EM | Admit: 2020-11-26 | Discharge: 2020-11-26 | Disposition: A | Discharge status: Home / Self Care | Payer: Medicare Other | Attending: Emergency Medicine | Admitting: Emergency Medicine

## 2020-11-26 DIAGNOSIS — F17210 Nicotine dependence, cigarettes, uncomplicated: Secondary | ICD-10-CM | POA: Diagnosis not present

## 2020-11-26 DIAGNOSIS — M545 Low back pain, unspecified: Secondary | ICD-10-CM | POA: Diagnosis not present

## 2020-11-26 MED ORDER — NAPROXEN 500 MG PO TABS: 500.0000 mg | ORAL_TABLET | Freq: Two times a day (BID) | ORAL | 0 refills | Status: AC

## 2020-11-26 NOTE — Discharge Instructions (Signed)
You were seen today for low back pain. Please pick up your naproxen from the pharmacy. Take it twice daily, once in the morning and at night. Take it for 2 weeks. Please return for any fever, loss of bladder function, or numbness down the legs.
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
